# Patient Record
Sex: Female | Born: 1950 | Race: Black or African American | Hispanic: No | Smoking: Light tobacco smoker
Health system: Southern US, Community
[De-identification: ages and names within clinical notes are randomized; demographics above are authoritative.]

## PROBLEM LIST (undated history)

## (undated) DIAGNOSIS — IMO0001 Reserved for inherently not codable concepts without codable children: Secondary | ICD-10-CM

## (undated) HISTORY — DX: Reserved for inherently not codable concepts without codable children: IMO0001

## (undated) HISTORY — PX: OTHER SURGICAL HISTORY: SHX169

## (undated) HISTORY — PX: GASTRIC BYPASS: SHX52

## (undated) HISTORY — PX: TUBAL LIGATION: SHX77

---

## 2004-02-14 LAB — HM COLONOSCOPY: HM Colonoscopy: NORMAL

## 2006-05-23 ENCOUNTER — Encounter: Payer: Self-pay | Admitting: Family Medicine

## 2007-03-20 ENCOUNTER — Encounter: Payer: Self-pay | Admitting: Family Medicine

## 2007-04-01 ENCOUNTER — Encounter: Payer: Self-pay | Admitting: Family Medicine

## 2007-08-13 ENCOUNTER — Encounter: Payer: Self-pay | Admitting: Family Medicine

## 2008-06-04 ENCOUNTER — Encounter: Payer: Self-pay | Admitting: Family Medicine

## 2008-09-10 ENCOUNTER — Encounter: Payer: Self-pay | Admitting: Family Medicine

## 2009-04-29 ENCOUNTER — Ambulatory Visit: Payer: Self-pay | Admitting: Family Medicine

## 2009-04-29 DIAGNOSIS — Z9884 Bariatric surgery status: Secondary | ICD-10-CM | POA: Insufficient documentation

## 2009-04-29 DIAGNOSIS — M5136 Other intervertebral disc degeneration, lumbar region: Secondary | ICD-10-CM

## 2009-04-29 DIAGNOSIS — F3289 Other specified depressive episodes: Secondary | ICD-10-CM | POA: Insufficient documentation

## 2009-04-29 DIAGNOSIS — F329 Major depressive disorder, single episode, unspecified: Secondary | ICD-10-CM

## 2009-04-29 DIAGNOSIS — I2699 Other pulmonary embolism without acute cor pulmonale: Secondary | ICD-10-CM

## 2009-04-29 DIAGNOSIS — M1710 Unilateral primary osteoarthritis, unspecified knee: Secondary | ICD-10-CM

## 2009-05-05 ENCOUNTER — Ambulatory Visit: Payer: Self-pay | Admitting: Family Medicine

## 2009-05-05 LAB — CONVERTED CEMR LAB: Prothrombin Time: 17.1 s

## 2009-05-11 ENCOUNTER — Ambulatory Visit: Payer: Self-pay | Admitting: Family Medicine

## 2009-05-11 DIAGNOSIS — G609 Hereditary and idiopathic neuropathy, unspecified: Secondary | ICD-10-CM | POA: Insufficient documentation

## 2009-05-11 DIAGNOSIS — D510 Vitamin B12 deficiency anemia due to intrinsic factor deficiency: Secondary | ICD-10-CM

## 2009-05-11 DIAGNOSIS — M069 Rheumatoid arthritis, unspecified: Secondary | ICD-10-CM | POA: Insufficient documentation

## 2009-05-11 DIAGNOSIS — L659 Nonscarring hair loss, unspecified: Secondary | ICD-10-CM | POA: Insufficient documentation

## 2009-05-12 LAB — CONVERTED CEMR LAB: Vitamin B-12: 386 pg/mL (ref 211–911)

## 2009-06-22 ENCOUNTER — Ambulatory Visit: Payer: Self-pay | Admitting: Family Medicine

## 2009-07-19 ENCOUNTER — Encounter (INDEPENDENT_AMBULATORY_CARE_PROVIDER_SITE_OTHER): Payer: Self-pay | Admitting: *Deleted

## 2009-07-19 ENCOUNTER — Telehealth: Payer: Self-pay | Admitting: Family Medicine

## 2009-08-05 ENCOUNTER — Ambulatory Visit: Payer: Self-pay | Admitting: Family Medicine

## 2009-08-12 ENCOUNTER — Ambulatory Visit: Payer: Self-pay | Admitting: Family

## 2009-08-12 ENCOUNTER — Other Ambulatory Visit: Admission: RE | Admit: 2009-08-12 | Discharge: 2009-08-12 | Payer: Self-pay | Admitting: Family

## 2009-08-12 LAB — CONVERTED CEMR LAB
Basophils Absolute: 0 10*3/uL (ref 0.0–0.1)
Basophils Relative: 0 % (ref 0–1)
Eosinophils Relative: 1 % (ref 0–5)
HCT: 42.5 % (ref 36.0–46.0)
Lymphs Abs: 1.7 10*3/uL (ref 0.7–4.0)
Monocytes Absolute: 0.4 10*3/uL (ref 0.1–1.0)
Monocytes Relative: 13 % — ABNORMAL HIGH (ref 3–12)
Neutro Abs: 0.8 10*3/uL — ABNORMAL LOW (ref 1.7–7.7)
Neutrophils Relative %: 27 % — ABNORMAL LOW (ref 43–77)
RBC: 4.51 M/uL (ref 3.87–5.11)
WBC: 2.9 10*3/uL — ABNORMAL LOW (ref 4.0–10.5)

## 2009-08-17 ENCOUNTER — Telehealth: Payer: Self-pay | Admitting: Family

## 2009-08-20 ENCOUNTER — Telehealth: Payer: Self-pay | Admitting: Family

## 2009-08-20 DIAGNOSIS — R87610 Atypical squamous cells of undetermined significance on cytologic smear of cervix (ASC-US): Secondary | ICD-10-CM

## 2009-08-20 LAB — CONVERTED CEMR LAB: Pap Smear: UNDETERMINED

## 2009-08-23 ENCOUNTER — Telehealth: Payer: Self-pay | Admitting: Family

## 2009-08-25 ENCOUNTER — Telehealth: Payer: Self-pay | Admitting: Family

## 2009-08-31 ENCOUNTER — Ambulatory Visit: Payer: Self-pay | Admitting: Family Medicine

## 2009-09-02 ENCOUNTER — Encounter: Admission: RE | Admit: 2009-09-02 | Discharge: 2009-09-02 | Payer: Self-pay | Admitting: Family Medicine

## 2009-09-02 ENCOUNTER — Ambulatory Visit: Payer: Self-pay | Admitting: Family

## 2009-09-02 ENCOUNTER — Encounter: Payer: Self-pay | Admitting: Family Medicine

## 2009-09-02 ENCOUNTER — Encounter: Payer: Self-pay | Admitting: Family

## 2009-09-02 ENCOUNTER — Telehealth: Payer: Self-pay | Admitting: Family

## 2009-09-02 LAB — CONVERTED CEMR LAB
AST: 25 units/L (ref 0–37)
Albumin: 4.5 g/dL (ref 3.5–5.2)
Basophils Relative: 0 % (ref 0–1)
Eosinophils Relative: 1 % (ref 0–5)
Hemoglobin: 14.7 g/dL (ref 12.0–15.0)
INR: 1.5
Indirect Bilirubin: 0.6 mg/dL (ref 0.0–0.9)
Lymphocytes Relative: 53 % — ABNORMAL HIGH (ref 12–46)
Lymphs Abs: 1.7 10*3/uL (ref 0.7–4.0)
MCHC: 32.7 g/dL (ref 30.0–36.0)
MCV: 94.5 fL (ref 78.0–100.0)
Platelets: 183 10*3/uL (ref 150–400)
RBC: 4.75 M/uL (ref 3.87–5.11)
RDW: 14.8 % (ref 11.5–15.5)
Total Protein: 7.9 g/dL (ref 6.0–8.3)
WBC: 3.2 10*3/uL — ABNORMAL LOW (ref 4.0–10.5)

## 2009-09-03 ENCOUNTER — Telehealth: Payer: Self-pay | Admitting: Family

## 2009-09-10 ENCOUNTER — Telehealth: Payer: Self-pay | Admitting: Family Medicine

## 2009-09-14 ENCOUNTER — Ambulatory Visit: Payer: Self-pay | Admitting: Obstetrics & Gynecology

## 2009-09-17 ENCOUNTER — Encounter: Admission: RE | Admit: 2009-09-17 | Discharge: 2009-09-17 | Payer: Self-pay | Admitting: Family Medicine

## 2009-10-08 ENCOUNTER — Ambulatory Visit: Payer: Self-pay | Admitting: Family Medicine

## 2009-10-15 ENCOUNTER — Ambulatory Visit: Payer: Self-pay | Admitting: Family Medicine

## 2009-12-07 ENCOUNTER — Ambulatory Visit: Payer: Self-pay | Admitting: Family Medicine

## 2009-12-07 ENCOUNTER — Encounter: Payer: Self-pay | Admitting: *Deleted

## 2009-12-07 DIAGNOSIS — H9319 Tinnitus, unspecified ear: Secondary | ICD-10-CM | POA: Insufficient documentation

## 2009-12-07 DIAGNOSIS — L259 Unspecified contact dermatitis, unspecified cause: Secondary | ICD-10-CM

## 2009-12-07 LAB — CONVERTED CEMR LAB: INR: 1.43 (ref <1.50)

## 2009-12-08 ENCOUNTER — Telehealth: Payer: Self-pay | Admitting: Family Medicine

## 2010-01-27 ENCOUNTER — Encounter: Payer: Self-pay | Admitting: Family Medicine

## 2010-01-28 ENCOUNTER — Ambulatory Visit: Payer: Self-pay | Admitting: Family Medicine

## 2010-01-31 ENCOUNTER — Telehealth: Payer: Self-pay | Admitting: Family Medicine

## 2010-02-21 ENCOUNTER — Ambulatory Visit: Admit: 2010-02-21 | Payer: Self-pay | Admitting: Family Medicine

## 2010-02-28 ENCOUNTER — Ambulatory Visit
Admission: RE | Admit: 2010-02-28 | Discharge: 2010-02-28 | Payer: Self-pay | Source: Home / Self Care | Attending: Family Medicine | Admitting: Family Medicine

## 2010-03-06 ENCOUNTER — Encounter: Payer: Self-pay | Admitting: Family Medicine

## 2010-03-15 NOTE — Letter (Signed)
Summary: Med List/[facility]  Med List/[facility]   Imported By: Lanelle Bal 05/04/2009 09:23:40  _____________________________________________________________________  External Attachment:    Type:   Image     Comment:   External Document

## 2010-03-15 NOTE — Assessment & Plan Note (Signed)
Summary: NOV: Depression, coumadin, etc    Vital Signs:  Patient profile:   60 year old female Height:      67 inches Weight:      250 pounds BMI:     39.30 O2 Sat:      98 % on Room air Pulse rate:   73 / minute BP sitting:   135 / 83  (left arm) Cuff size:   large  Vitals Entered By: Kathlene November (April 29, 2009 10:56 AM)  O2 Flow:  Room air CC: Np- weight gain- severe depression- crying anxiety Is Patient Diabetic? No   CC:  Np- weight gain- severe depression- crying anxiety.  History of Present Illness: Coumadin 4mg  daily since October and hasn't been checked.  Was previously seeing Dr. Ardine Bjork.   was on paxil for qhile for her mood but felt it work well so stopped it. Gets depressed sometimes.  Says now lives along which she loves but she says sometimes gets down and lonely.  Was peviously living wiht her son but evidently this was nto a good arrangment.  Not sleeping well.   Needs a new referral for an orthopedist for her knees and her back. Hard to stand for more than 2-3 minutes.  Has gained about 50 lbs in the less than year. Last set up of blood work was in October. Was on steroid for her PE but she off teh steroids now.  Wants a panniculectomy.  Needs a referral for surgery. Can't remember her name.    Uses betamethasone that she uses for her hair and her skin rashes.   Anticoagulation Management History:      The patient is on coumadin and comes in today for a routine follow up visit.  Today's INR is 1.4.    Habits & Providers  Alcohol-Tobacco-Diet     Alcohol drinks/day: <1     Tobacco Status: current     Cigarette Packs/Day: <0.25     Year Started: 25  Exercise-Depression-Behavior     Does Patient Exercise: no     Have you felt down or hopeless? no     STD Risk: never     Drug Use: no     Seat Belt Use: always  Current Medications (verified): 1)  Betamethasone Dipropionate 0.05 % Oint (Betamethasone Dipropionate) .... Use As Directed As Needed 2)   Verapamil Hcl Cr 360 Mg Xr24h-Cap (Verapamil Hcl) .... Take 1 Cap By Mouth Once Daily 3)  Lyrica 100 Mg Caps (Pregabalin) .... Take 1 Cap By Mouth Three Times A Day 4)  Atenolol 50 Mg Tabs (Atenolol) .... Take 1 Tab By Mouth Once Daily 5)  Warfarin Sodium 4 Mg Tabs (Warfarin Sodium) .... Take 1 Tab By Mouth Once Daily As Directed 6)  Prednisone .... Take As Directed As Needed 7)  Polyethylene Glycol 3350 .... Take 2 Times Daily As Directed As Needed 8)  Calcium 500/d 500-200 Mg-Unit Tabs (Calcium Carbonate-Vitamin D) .... Take One Tablet By Mouth Once A Day 9)  Lorazepam 0.5 Mg Tabs (Lorazepam) .... Take One Tablet By Mouth Once A Day 10)  Xyzal 5 Mg Tabs (Levocetirizine Dihydrochloride) .... Take One Tablet By Mouth Oncea  Day 11)  Vitamin B Complex  Caps (B Complex Vitamins) .... Take One Tablet By Mouth Once A Day 12)  Proventil Hfa 108 (90 Base) Mcg/act Aers (Albuterol Sulfate) 13)  Lyrica 100 Mg Caps (Pregabalin) .... Take One Tablet By Mouth Oncea D Ay 14)  Norflex 30 Mg/ml Soln (  Orphenadrine Citrate)  Allergies (verified): 1)  ! Lisinopril (Lisinopril)  Comments:  Nurse/Medical Assistant: The patient's medications and allergies were reviewed with the patient and were updated in the Medication and Allergy Lists. 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   Past Surgical History: Partial colectomy for diverticulitis.  Tubal ligation Right arm  Gastric bypass > 20 years   Family History: Brothers wiht DM.  Mother with MI, HTN renal failure.    Social History: Disabled. BA degree. Has 2 sons. Single.  Current Smoker Alcohol use-yes Drug use-no Regular exercise-no 2 caffeinated drinks per day. Smoking Status:  current Packs/Day:  <0.25 Does Patient Exercise:  no STD Risk:  never Drug Use:  no Seat Belt Use:  always  Physical Exam  General:   Well-developed,well-nourished,in no acute distress; alert,appropriate and cooperative throughout examination Skin:  no rashes.   Psych:  Oriented X3, labile affect, and tearful.     Impression & Recommendations:  Problem # 1:  PULMONARY EMBOLISM (ICD-415.19)  INR not therapuetic today. H.O given with new doses. Need to get records to see how long needs to be on coumadin. She says she has been on it for 3 years already. Mood questionnair neg for Bipolar.  Her updated medication list for this problem includes:    Warfarin Sodium 4 Mg Tabs (Warfarin sodium) .Marland Kitchen... Take 1 tab by mouth once daily as directed    Coumadin 4 Mg Tabs (Warfarin sodium) ..... Sunday - 4 mg, monday - 6 mg, tuesday - 4 mg, wednesday - 4 mg, thursday - 6 mg, friday - 4 mg, saturday - 4 mg  Orders: Fingerstick (42595) Protime INR (63875)  Problem # 2:  SKIN RASH (ICD-782.1) Did refill today.  Her updated medication list for this problem includes:    Betamethasone Dipropionate 0.05 % Oint (Betamethasone dipropionate) ..... Use as directed as needed    Triamcinolone Acetonide 0.5 % Crea (Triamcinolone acetonide) .Marland Kitchen... Appply two times a day to affected area.  Problem # 3:  DEPRESSION (ICD-311) PHQ-9 score was 17 (moderately severe). Dsicussed starting an SSRI. Warned about potentail SE. F/U in 3 weeks.  Her updated medication list for this problem includes:    Lorazepam 0.5 Mg Tabs (Lorazepam) .Marland Kitchen... Take one tablet by mouth once a day    Citalopram Hydrobromide 20 Mg Tabs (Citalopram hydrobromide) .Marland Kitchen... 1/2 by mouth once a day for one week, then 1 tab daily  Problem # 4:  KNEE PAIN, BILATERAL (ICD-719.46) Wil refer her to ortho. Soujnds like she has been seen by ortho for this in the past, but not recently.   Her updated medication list for this problem includes:    Norflex 30 Mg/ml Soln (Orphenadrine citrate)  Problem # 5:  BACK PAIN, CHRONIC (ICD-724.5) She feels it is from her pannus.   Her updated medication  list for this problem includes:    Norflex 30 Mg/ml Soln (Orphenadrine citrate)  Problem # 6:  BARIATRIC SURGERY STATUS (ICD-V45.86) Will reveiew old records. She may need to additional labs checked to make sure not low nutritent.s   Complete Medication List: 1)  Betamethasone Dipropionate 0.05 % Oint (Betamethasone dipropionate) .... Use as directed as needed 2)  Verapamil Hcl Cr 360 Mg Xr24h-cap (Verapamil hcl) .... Take 1 cap by mouth once daily 3)  Lyrica 100 Mg Caps (Pregabalin) .... Take 1 cap by mouth three times a day  4)  Atenolol 50 Mg Tabs (Atenolol) .... Take 1 tab by mouth once daily 5)  Warfarin Sodium 4 Mg Tabs (Warfarin sodium) .... Take 1 tab by mouth once daily as directed 6)  Prednisone  .... Take as directed as needed 7)  Polyethylene Glycol 3350  .... Take 2 times daily as directed as needed 8)  Calcium 500/d 500-200 Mg-unit Tabs (Calcium carbonate-vitamin d) .... Take one tablet by mouth once a day 9)  Lorazepam 0.5 Mg Tabs (Lorazepam) .... Take one tablet by mouth once a day 10)  Xyzal 5 Mg Tabs (Levocetirizine dihydrochloride) .... Take one tablet by mouth oncea  day 11)  Vitamin B Complex Caps (B complex vitamins) .... Take one tablet by mouth once a day 12)  Proventil Hfa 108 (90 Base) Mcg/act Aers (Albuterol sulfate) 13)  Lyrica 100 Mg Caps (Pregabalin) .... Take one tablet by mouth oncea d ay 14)  Norflex 30 Mg/ml Soln (Orphenadrine citrate) 15)  Triamcinolone Acetonide 0.5 % Crea (Triamcinolone acetonide) .... Appply two times a day to affected area. 16)  Coumadin 4 Mg Tabs (Warfarin sodium) .... "Sunday - 4 mg, monday - 6 mg, tuesday - 4 mg, wednesday - 4 mg, thursday - 6 mg, friday - 4 mg, saturday - 4 mg 17)  Citalopram Hydrobromide 20 Mg Tabs (Citalopram hydrobromide) .... 1/2 by mouth once a day for one week, then 1 tab daily  Anticoagulation Management Assessment/Plan:      She is to have a PT/INR in 1 week.         Current Anticoagulation  Instructions: Warfarin sodium 4 mg tabs: Take 1 tab by mouth once daily as directed Coumadin 4 mg tabs:  Sunday - 4 mg, Monday - 6 mg, Tuesday - 4 mg, Wednesday - 4 mg, Thursday - 6 mg, Friday - 4 mg, Saturday - 4 mg.  The patient's dosage of coumadin will be increased.  The new dosage includes: Repeat PT/INR in 1 week.    Patient Instructions: 1)  Follow up to recheck your coumadin in one week.  Prescriptions: CITALOPRAM HYDROBROMIDE 20 MG TABS (CITALOPRAM HYDROBROMIDE) 1/2 by mouth once a day for one week, then 1 tab daily  #30 x 0   Entered and Authorized by:   Bridney Guadarrama MD   Signed by:   Atha Mcbain MD on 04/29/2009   Method used:   Electronically to        [facility] [address] [id number]* (retail)       [address]       [place], [place]  [address]       Ph: [phone]       Fax: [phone]   RxID:   [id number] TRIAMCINOLONE ACETONIDE 0.5 % CREA (TRIAMCINOLONE ACETONIDE) appply two times a day to affected area.  #40 grams x 0   Entered and Authorized by:   Lillyan Hitson MD   Signed by:   Aubrea Meixner MD on 04/29/2009   Method used:   Electronically to        [facility] [address] [id number]* (retail)       [address], [place]  [address]       Ph: [phone]       Fax: [phone]   RxID:   [id number] ATENOLOL 50 MG TABS (ATENOLOL) Take 1 tab by mouth once daily  #30 x 1   Entered and Authorized by:   Nani Gasser MD   Signed  by:   Nani Gasser MD on 04/29/2009   Method used:   Electronically to        [facility] [phone]* (retail)       [address] [place], [place]  [address]       Ph: [phone]       Fax: [phone]   RxID:   [id number] BETAMETHASONE DIPROPIONATE 0.05 % OINT (BETAMETHASONE DIPROPIONATE) Use as directed as needed  #40 grams x 1   Entered and Authorized by:   Nani Gasser MD   Signed by:   Nani Gasser MD on 04/29/2009   Method used:   Electronically to        [facility]  [phone]* (retail)       [address] [place], [place]  [address]       Ph: [phone]       Fax: [phone]   RxID:   [id number]   Laboratory Results   Blood Tests   Date/Time Received: 04/29/2009 Date/Time Reported: 04/29/2009  PT: 14.4 s   (Normal Range: 10.6-13.4)  INR: 1.4   (Normal Range: 0.88-1.12   Therap INR: 2.0-3.5)     Appended Document: NOV: Depression, coumadin, etc   TD Result Date:  02/13/2002 TD Result:  given TD Next Due:  10 yr HDL Result Date:  03/17/2007 HDL Result:  80 HDL Next Due:  1 yr LDL Result Date:  03/17/2007 LDL Result:  104 LDL Next Due:  1 yr Flex Sig Next Due:  Not Indicated Colonoscopy Result Date:  02/14/2004 Colonoscopy Result:  normal Hemoccult Next Due:  Not Indicated PAP Result Date:  05/23/2006 PAP Result:  normal PAP Next Due:  2 yr Mammogram Result Date:  08/13/2007 Mammogram Result:  normal Mammogram Next Due:  1 yr     Past History:  Past Medical History: 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 Estella Husk

## 2010-03-15 NOTE — Progress Notes (Signed)
Summary: Pt no show for [facility] appt  Phone Note From Other Clinic   Caller: Provider Summary of Call: SW Aram Beecham at [facility], pt was a "no show" for her appt today Initial call taken by: Lannette Donath,  August 25, 2009 9:45 AM  Follow-up for Phone Call        Pt told me that she had rescheduled with Dr. Norberto Sorenson to August 11th.  Could you please call office and verify this.  Follow-up by: Lemont Fillers FNP,  August 25, 2009 10:13 AM  Additional Follow-up for Phone Call Additional follow up Details #1::        I called Aram Beecham again at [facility] [phone] & she said the patient does not have an appt 8/11 and that pt scheduled this morning's 8:45 appt yesterday afternoon since she needed to be seen "right away" Diane Tomerlin  August 25, 2009 11:00 AM    Additional Follow-up for Phone Call Additional follow up Details #2::    Please call patient and let her know that it is important that she reschedule her GYN visit.  Follow-up by: Lemont Fillers FNP,  August 25, 2009 11:14 AM  Additional Follow-up for Phone Call Additional follow up Details #3:: Details for Additional Follow-up Action Taken: LM on pt voicemail. Additional Follow-up by: Kathlene November,  August 25, 2009 11:21 AM

## 2010-03-15 NOTE — Progress Notes (Signed)
Summary: rx feill  Phone Note Call from Patient   Caller: Patient Summary of Call: Patient said she called her pharmacy yesterday and they told her she needed to call here to get a refill on her meds.  Polyethylene and Triamcinole Initial call taken by: Vanessa Swaz[place],  July 19, 2009 11:02 AM    Prescriptions: TRIAMCINOLONE ACETONIDE 0.5 % CREA (TRIAMCINOLONE ACETONIDE) appply two times a day to affected area.  #40 grams x 1   Entered by:   Kathlene November   Authorized by:   Nani Gasser MD   Signed by:   Kathlene November on 07/19/2009   Method used:   Electronically to        [facility] [phone]* (retail)       [address] [place], [place]  [address]       Ph: [phone]       Fax: [phone]   RxID:   [id number] POLYETHYLENE GLYCOL 3350 Take 2 times daily as directed as needed  #1 x 2   Entered by:   Kathlene November   Authorized by:   Nani Gasser MD   Signed by:   Kathlene November on 07/19/2009   Method used:   Print then Give to Patient   RxID:   [id number]

## 2010-03-15 NOTE — Assessment & Plan Note (Signed)
Summary: cpe with pap- jr   Vital Signs:  Patient profile:   60 year old female Height:      67 inches Weight:      230 pounds Pulse rate:   81 / minute BP sitting:   140 / 93  (left arm) Cuff size:   large  Vitals Entered By: Kathlene November (August 12, 2009 3:20 PM) CC: physical with pap   Primary Care Provider:  Nani Gasser MD  CC:  physical with pap.  History of Present Illness: Andrea Cisneros is a 60 year old female who presents today for a "complete physical and Pap smear".  She has several complaints today:  1) Uterine cramping-  Notes that she has severe cramping- though she does not actually bleed.  Notes occasional pink discharge.  LMP was 3 years ago- notes that she has history of fibroids.   2) Skin rash- reports that she has seen derm in the past and has been prescribed topical steroid ointments.  Reports that Dr. Linford Arnold decreased the dose of her betamethasone from 1% down to 0.5%. She is requesting rx refills.  Current Medications (verified): 1)  Betamethasone Dipropionate 0.05 % Oint (Betamethasone Dipropionate) .... Use As Directed As Needed 2)  Verapamil Hcl Cr 360 Mg Xr24h-Cap (Verapamil Hcl) .... Take 1 Cap By Mouth Once Daily 3)  Lyrica 100 Mg Caps (Pregabalin) .... Take 1 Cap By Mouth Three Times A Day 4)  Atenolol 50 Mg Tabs (Atenolol) .... Take 1 Tab By Mouth Once Daily 5)  Warfarin Sodium 4 Mg Tabs (Warfarin Sodium) .... Take 1 Tab By Mouth Once Daily As Directed 6)  Polyethylene Glycol 3350 .... Take 2 Times Daily As Directed As Needed 7)  Calcium 500/d 500-200 Mg-Unit Tabs (Calcium Carbonate-Vitamin D) .... Take One Tablet By Mouth Once A Day 8)  Vitamin B Complex  Caps (B Complex Vitamins) .... Take One Tablet By Mouth Once A Day 9)  Proventil Hfa 108 (90 Base) Mcg/act Aers (Albuterol Sulfate) 10)  Lyrica 100 Mg Caps (Pregabalin) .... Take One Tablet By Mouth Oncea D Ay 11)  Triamcinolone Acetonide 0.5 % Crea (Triamcinolone Acetonide) .... Appply Two  Times A Day To Affected Area. 12)  Citalopram Hydrobromide 20 Mg Tabs (Citalopram Hydrobromide) .... 1/2 By Mouth Once A Day For One Week, Then 1 Tab Daily 13)  Amoxicillin 500 Mg Caps (Amoxicillin) .... Take 1 Tablet By Mouth Three Times A Day For 10 Days 14)  Clindamycin Hcl 300 Mg Caps (Clindamycin Hcl) .... Take 1 Tablet By Mouth Four Times A Day For 10 Days 15)  Hydrocodone-Acetaminophen 5-500 Mg Tabs (Hydrocodone-Acetaminophen) .Marland Kitchen.. 1-2 Tab By Mouth Every 4-6 Hour As Needed 16)  Coumadin 4 Mg Tabs (Warfarin Sodium) .... Sunday - 4 Mg, Monday - 6 Mg, Tuesday - 4 Mg, Wednesday - 4 Mg, Thursday - 4 Mg, Friday - 4 Mg, Saturday - 6 Mg 17)  Coumadin 1 Mg Tabs (Warfarin Sodium) .... Sunday - 4 Mg, Monday - 6 Mg, Tuesday - 4 Mg, Wednesday - 4 Mg, Thursday - 4 Mg, Friday - 4 Mg, Saturday - 6 Mg  Allergies (verified): 1)  ! Lisinopril (Lisinopril)  Comments:  Nurse/Medical Assistant: The patient's medications and allergies were reviewed with the patient and were updated in the Medication and Allergy Lists. 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 Estella Husk  Past Surgical History: Last updated: 04/29/2009 Partial colectomy for diverticulitis.  Tubal ligation Right arm  Gastric bypass > 20 years   Family History: Last updated: 04/29/2009 Brothers wiht DM.  Mother with MI, HTN renal failure.    Social History: Last updated: 04/29/2009 Disabled. BA degree. Has 2 sons. Single.  Current Smoker Alcohol use-yes Drug use-no Regular exercise-no 2 caffeinated drinks per day.   Risk Factors: Alcohol Use: <1 (04/29/2009) Exercise: no (04/29/2009)  Risk Factors: Smoking Status: current (04/29/2009) Packs/Day: <0.25 (04/29/2009)  Review of Systems       Constitutional: Denies Fever ENT:  notes chronic allergic rhinits or sore throat. Resp: Denies  cough CV:  Denies Chest Pain or SOB GI:  Denies nausea or vomitting GU: Denies dysuria  Lymphatic: Denies lymphadenopathy Musculoskeletal:  notes + fibromyalgia pain, and bilateral right shoulder Skin:  complains of rash on her face since worse since dose of steroid cream was reduced- she is requesting refill on her topical steroid cream Psychiatric: notes + depression- though reports that it is controlled on citalopram. 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 No apparent alopecia or balding. Eyes:  PERRLA Ears:  External ear exam shows no significant lesions or deformities.  Otoscopic examination reveals clear canals, tympanic membranes are intact bilaterally without bulging, retraction, inflammation or discharge. Hearing is grossly normal bilaterally. Mouth:  Oral mucosa and oropharynx without lesions or exudates.  Teeth in good repair. Neck:  No deformities, masses, or tenderness noted. Chest Wall:  No deformities, masses, or tenderness noted. Breasts:  No mass, nodules, thickening, tenderness, bulging, retraction, inflamation, nipple discharge or skin changes noted.   Lungs:  Normal respiratory effort, chest expands symmetrically. Lungs are clear to auscultation, no crackles or wheezes. Heart:  Normal rate and regular rhythm. S1 and S2 normal without gallop, murmur, click, rub or other extra sounds. Abdomen:  +pannus- soft, non-tender, non-distended Rectal:  No external hemorrhoids noted, no anal lesions, no perianal rash.   Genitalia:  Pelvic Exam:        External: normal female genitalia without lesions or masses        Vagina: normal without lesions or masses        Cervix: normal without lesions or masses        Adnexa: tender adnexal bilaterally         Uterus: enlarged        Pap smear: performed Msk:  No deformity or scoliosis noted of thoracic or lumbar spine.   Extremities:  No clubbing, cyanosis, edema, or deformity noted  Neurologic:  No cranial nerve deficits noted. Station and gait are normal. Plantar reflexes are down-going bilaterally. DTRs are symmetrical throughout. Sensory, motor and coordinative functions appear intact. Skin:  + hyperpigmented rash lesions noted on face and chest/arms. + intertrigo noted beneath breasts/pannus Cervical Nodes:  No lymphadenopathy noted Axillary Nodes:  No palpable lymphadenopathy Inguinal Nodes:  No significant adenopathy Psych:  Oriented X3, memory intact for recent and remote, normally interactive, not anxious appearing, and not depressed appearing.     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   Orders: T-CBC w/Diff ([id number])  Problem # 2:  MENORRHAGIA, POSTMENOPAUSAL (ICD-627.1) Assessment: New  Patient with cramping and "pink" vaginal discharge which is intermittent  in nature.  Suspect uterine fibroids based on examination.  Pap performed today, will refer to GYN for further evaluation.  Orders: Gastroenterology Referral (GI)  Problem # 3:  SKIN RASH (ICD-782.1) Assessment: Unchanged Has been followed by Derm- and was rx'd steroid cream.  Will try to obtain records from [facility].  Add nystatin for intertrigo beneath breasts and abdomen. Her updated medication list for this problem includes:    Betamethasone Valerate 0.1 % Crea (Betamethasone valerate) .Marland Kitchen... Apply twice daily to affected area    Triamcinolone Acetonide 0.5 % Crea (Triamcinolone acetonide) .Marland Kitchen... Appply two times a day to affected area.  Complete Medication List: 1)  Betamethasone Valerate 0.1 % Crea (Betamethasone valerate) .... Apply twice daily to affected area 2)  Verapamil Hcl Cr 360 Mg Xr24h-cap (Verapamil hcl) ....  Take 1 cap by mouth once daily 3)  Lyrica 100 Mg Caps (Pregabalin) .... Take 1 cap by mouth three times a day 4)  Atenolol 50 Mg Tabs (Atenolol) .... Take 1 tab by mouth once daily 5)  Warfarin Sodium 4 Mg Tabs (Warfarin sodium) .... Take 1 tab by mouth once daily as directed 6)  Polyethylene Glycol 3350  .... Take 2 times daily as directed as needed 7)  Calcium 500/d 500-200 Mg-unit Tabs (Calcium carbonate-vitamin d) .... Take one tablet by mouth once a day 8)  Vitamin B Complex Caps (B complex vitamins) .... Take one tablet by mouth once a day 9)  Proventil Hfa 108 (90 Base) Mcg/act Aers (Albuterol sulfate) 10)  Lyrica 100 Mg Caps (Pregabalin) .... Take one tablet by mouth oncea d ay 11)  Triamcinolone Acetonide 0.5 % Crea (Triamcinolone acetonide) .... Appply two times a day to affected area. 12)  Citalopram Hydrobromide 20 Mg Tabs (Citalopram hydrobromide) .... One tablet by mouth daily 13)  Amoxicillin 500 Mg Caps (Amoxicillin) .... Take 1 tablet by mouth three times a day for 10 days 14)  Clindamycin Hcl 300 Mg Caps (Clindamycin hcl) .... Take 1 tablet by mouth four times a day for 10 days 15)  Hydrocodone-acetaminophen 5-500 Mg Tabs (Hydrocodone-acetaminophen) .Marland Kitchen.. 1-2 tab by mouth every 4-6 hour as needed 16)  Coumadin 4 Mg Tabs (Warfarin sodium) .... Sunday - 4 mg, monday - 6 mg, tuesday - 4 mg, wednesday - 4 mg, thursday - 4 mg, friday - 4 mg, saturday - 6 mg 17)  Coumadin 1 Mg Tabs (Warfarin sodium) .... Sunday - 4 mg, monday - 6 mg, tuesday - 4 mg, wednesday - 4 mg, thursday - 4 mg, friday - 4 mg, saturday - 6 mg 18)  Nystatin Powd (Nystatin) .... Apply twice daily beneath breasts and abdomen  Other Orders: Pelvic & Breast Exam ( Medicare)  ([id number])  Patient Instructions: 1)  You will be called about your pap smear results and your referral to GYN. 2)  Please complete your blood work downstairs. 3)  Follow up in 3 months with Dr. Linford Arnold. Prescriptions: TRIAMCINOLONE ACETONIDE  0.5 % CREA (TRIAMCINOLONE ACETONIDE) appply two times a day to affected area.  #40 grams x 0   Entered and Authorized by:   Lemont Fillers FNP   Signed by:   Lemont Fillers FNP on 08/17/2009   Method used:   Printed then faxed to ...       [facility] (mail-order)             , [place]         Ph: [phone]       Fax:  [phone]   RxID:   [id number] NYSTATIN  POWD (NYSTATIN) apply twice daily beneath breasts and abdomen  #1 x 0   Entered and Authorized by:   Lemont Fillers FNP   Signed by:   Lemont Fillers FNP on 08/17/2009   Method used:   Printed then faxed to ...       [facility] (mail-order)             , [place]         Ph: [phone]       Fax: [phone]   RxID:   [id number] BETAMETHASONE VALERATE 0.1 % CREA (BETAMETHASONE VALERATE) apply twice daily to affected area  #1 x 0   Entered and Authorized by:   Lemont Fillers FNP   Signed by:   Lemont Fillers FNP on 08/17/2009   Method used:   Printed then faxed to ...       [facility] (mail-order)             , [place]         Ph: [phone]       Fax: [phone]   RxID:   [id number]

## 2010-03-15 NOTE — Assessment & Plan Note (Signed)
Summary: Sinusitis, coumadin   Vital Signs:  Patient profile:   60 year old female Height:      67 inches Weight:      250 pounds Temp:     98.6 degrees F oral Pulse rate:   73 / minute BP sitting:   132 / 84  (left arm) Cuff size:   large  Vitals Entered By: Kathlene November (May 11, 2009 1:56 PM) CC: cough with green phlegm, H/A, sinus congestion for 3 days   CC:  cough with green phlegm, H/A, and sinus congestion for 3 days.  History of Present Illness: cough with green phlegm,frontal H/A, sinus congestion for 3 days.  Cough is wet and productive. Rattling when she breaths.  Gets this about 3 x a year. No fever.  Hx of allergies. Not using any allergy medicines.   Anticoagulation Management History:      The patient is on coumadin and comes in today for a routine follow up visit.  Her last INR was 1.9 and today's INR is 2.8.    Current Medications (verified): 1)  Betamethasone Dipropionate 0.05 % Oint (Betamethasone Dipropionate) .... Use As Directed As Needed 2)  Verapamil Hcl Cr 360 Mg Xr24h-Cap (Verapamil Hcl) .... Take 1 Cap By Mouth Once Daily 3)  Lyrica 100 Mg Caps (Pregabalin) .... Take 1 Cap By Mouth Three Times A Day 4)  Atenolol 50 Mg Tabs (Atenolol) .... Take 1 Tab By Mouth Once Daily 5)  Warfarin Sodium 4 Mg Tabs (Warfarin Sodium) .... Take 1 Tab By Mouth Once Daily As Directed 6)  Polyethylene Glycol 3350 .... Take 2 Times Daily As Directed As Needed 7)  Calcium 500/d 500-200 Mg-Unit Tabs (Calcium Carbonate-Vitamin D) .... Take One Tablet By Mouth Once A Day 8)  Vitamin B Complex  Caps (B Complex Vitamins) .... Take One Tablet By Mouth Once A Day 9)  Proventil Hfa 108 (90 Base) Mcg/act Aers (Albuterol Sulfate) 10)  Lyrica 100 Mg Caps (Pregabalin) .... Take One Tablet By Mouth Oncea D Ay 11)  Triamcinolone Acetonide 0.5 % Crea (Triamcinolone Acetonide) .... Appply Two Times A Day To Affected Area. 12)  Citalopram Hydrobromide 20 Mg Tabs (Citalopram Hydrobromide) ....  1/2 By Mouth Once A Day For One Week, Then 1 Tab Daily  Allergies (verified): 1)  ! Lisinopril (Lisinopril)  Comments:  Nurse/Medical Assistant: The patient's medications and allergies were reviewed with the patient and were updated in the Medication and Allergy Lists. 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 Estella Husk  Physical Exam  General:  Well-developed,well-nourished,in no acute distress; alert,appropriate and cooperative throughout examination Head:  Normocephalic and atraumatic without obvious abnormalities. No apparent alopecia or balding. Eyes:  No corneal or conjunctival inflammation noted. EOMI. Perrla.  Ears:  External ear exam shows no significant lesions or deformities.  Otoscopic examination reveals clear canals, tympanic membranes are intact bilaterally without bulging, retraction, inflammation or discharge. Hearing is grossly normal bilaterally. Nose:  External nasal examination shows no deformity or inflammation. Nasal mucosa are pink and moist without lesions or exudates. Mouth:  Oral mucosa and oropharynx without lesions or exudates.  Teeth in good repair. Neck:  No deformities, masses, or tenderness noted. Lungs:  Normal respiratory effort, chest expands symmetrically. Lungs are clear to auscultation, no crackles or wheezes. Heart:  Normal rate and regular rhythm. S1 and S2 normal  without gallop, murmur, click, rub or other extra sounds. Skin:  no rashes.   Cervical Nodes:  No lymphadenopathy noted Psych:  Cognition and judgment appear intact. Alert and cooperative with normal attention span and concentration. No apparent delusions, illusions, hallucinations   Impression & Recommendations:  Problem # 1:  SINUSITIS - ACUTE-NOS (ICD-461.9) Assessment New  Her updated medication list for this problem includes:    Amoxicillin 500  Mg Caps (Amoxicillin) .Marland Kitchen... Take 1 tablet by mouth three times a day for 10 days  Instructed on treatment. Call if symptoms persist or worsen.   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 Her last injection was in October per her old records.   Complete Medication List: 1)  Betamethasone Dipropionate 0.05 % Oint (Betamethasone dipropionate) .... Use as directed as needed 2)  Verapamil Hcl Cr 360 Mg Xr24h-cap (Verapamil hcl) .... Take 1 cap by mouth once daily 3)  Lyrica 100 Mg Caps (Pregabalin) .... Take 1 cap by mouth three times a day 4)  Atenolol 50 Mg Tabs (Atenolol) .... Take 1 tab by mouth once daily 5)  Warfarin Sodium 4 Mg Tabs (Warfarin sodium) .... Take 1 tab by mouth once daily as directed 6)  Polyethylene Glycol 3350  .... Take 2 times daily as directed as needed 7)  Calcium 500/d 500-200 Mg-unit Tabs (Calcium carbonate-vitamin d) .... Take one tablet by mouth once a day 8)  Vitamin B Complex Caps (B complex vitamins) .... Take one tablet by mouth once a day 9)  Proventil Hfa 108 (90 Base) Mcg/act Aers (Albuterol sulfate) 10)  Lyrica 100 Mg Caps (Pregabalin) .... Take one tablet by mouth oncea d ay 11)  Triamcinolone Acetonide 0.5 % Crea (Triamcinolone acetonide) .... Appply two times a day to affected area. 12)  Citalopram Hydrobromide 20 Mg Tabs (Citalopram hydrobromide) .... 1/2 by mouth once a day for one week, then 1 tab daily 13)  Amoxicillin 500 Mg Caps (Amoxicillin) .... Take 1 tablet by mouth three times a day for 10 days 14)  Warfarin Sodium 6 Mg Tabs (Warfarin sodium) .... Take as directed.  Other Orders: Fingerstick ([id number]) Protime INR (09811)  Anticoagulation Management Assessment/Plan:      The target INR is 2.0-3.0.  She is to have a PT/INR in 2 weeks.  Anticoagulation instructions were given to patient.         Current Anticoagulation Instructions: The patient is to continue  with the same dose of coumadin.  This dosage includes: Warfarin sodium 4 mg tabs: Take 1 tab by mouth once daily as directed.  Repeat PT/INR in 2 weeks.   Prescriptions: TRIAMCINOLONE ACETONIDE 0.5 % CREA (TRIAMCINOLONE ACETONIDE) appply two times a day to affected area.  #40 grams x 1   Entered and Authorized by:   Nani Gasser MD   Signed by:   Nani Gasser MD on 05/11/2009   Method used:   Printed then faxed to ...       [facility] (mail-order)             , [place]         Ph: [phone]       Fax: [phone]   RxID:   [id number] CITALOPRAM HYDROBROMIDE 20 MG TABS (CITALOPRAM HYDROBROMIDE) 1/2 by mouth once a day for one week, then 1 tab daily  #90 x 1   Entered and Authorized by:   Nani Gasser MD   Signed by:  Nani Gasser MD on 05/11/2009   Method used:   Printed then faxed to ...       [facility] (mail-order)             , [place]         Ph: [phone]       Fax: [phone]   RxID:   [id number] WARFARIN SODIUM 6 MG TABS (WARFARIN SODIUM) Take as directed.  #90 x 1   Entered and Authorized by:   Nani Gasser MD   Signed by:   Nani Gasser MD on 05/11/2009   Method used:   Printed then faxed to ...       [facility] (mail-order)             , [place]         Ph: [phone]       Fax: [phone]   RxID:   [id number] WARFARIN SODIUM 4 MG TABS (WARFARIN SODIUM) Take 1 tab by mouth once daily as directed  #90 x 1   Entered and Authorized by:   Nani Gasser MD   Signed by:   Nani Gasser MD on 05/11/2009   Method used:   Printed then faxed to ...       [facility] (mail-order)             , [place]         Ph: [phone]       Fax: [phone]   RxID:   [id number] ATENOLOL 50 MG TABS (ATENOLOL) Take 1 tab by mouth once daily  #90 x 1   Entered and Authorized by:   Nani Gasser MD   Signed by:   Nani Gasser MD on 05/11/2009   Method used:   Printed then faxed to  ...       [facility] (mail-order)             , [place]         Ph: [phone]       Fax: [phone]   RxID:   [id number] VERAPAMIL HCL CR 360 MG XR24H-CAP (VERAPAMIL HCL) Take 1 cap by mouth once daily  #90 x 3   Entered and Authorized by:   Nani Gasser MD   Signed by:   Nani Gasser MD on 05/11/2009   Method used:   Printed then faxed to ...       [facility] (mail-order)             , [place]         Ph: [phone]       Fax: [phone]   RxID:   [id number] AMOXICILLIN 500 MG CAPS (AMOXICILLIN) Take 1 tablet by mouth three times a day for 10 days  #30 x 0   Entered and Authorized by:   Nani Gasser MD   Signed by:   Nani Gasser MD on 05/11/2009   Method used:   Electronically to        [facility] [phone]* (retail)       [address] [place], [place]  [address]       Ph: [phone]       Fax: [phone]   RxID:   [id number]   Laboratory Results   Blood Tests   Date/Time Received: 05/10/2009 Date/Time Reported: 05/10/2009  PT: 20.1 s   (Normal Range: 10.6-13.4)  INR: 2.8   (Normal Range: 0.88-1.12   Therap  INR: 2.0-3.5)

## 2010-03-15 NOTE — Letter (Signed)
Summary: Records Dated 05-09-06 thru 12-07-08/[facility]  Records Dated 05-09-06 thru 12-07-08/[facility]   Imported By: Lanelle Bal 05/20/2009 08:39:04  _____________________________________________________________________  External Attachment:    Type:   Image     Comment:   External Document

## 2010-03-15 NOTE — Assessment & Plan Note (Signed)
Summary: Ringing in her ears   Vital Signs:  Patient profile:   60 year old female Height:      67 inches Weight:      219 pounds Pulse rate:   80 / minute BP sitting:   123 / 92  (right arm) Cuff size:   large  Vitals Entered By: Avon Gully CMA, Duncan Dull) (December 07, 2009 3:37 PM) CC: ringing in the ears x 2 months   Primary Care Provider:  Nani Gasser MD  CC:  ringing in the ears x 2 months.  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 Usually gets from her derm.  Wants to know if I will refill it.    Ringing in ears for 2 months for intermittant. Can last 3-4 days at a time. Gets pain posterior to the right ear that radiates upward and this scares her.  Ring is worse on the left. no hearing loss.  Was tested in Aug. No ear drainage. Using hypoflavanoids and that is not helping. Her mother ahd surgery on her ears for ringing. Has been using benadrul for sinus pressure  Felt the betamethasone cream is not as strong as the oint and would like it to be changed back to the ointment.    Says her ortho wants me to rx pain meds for her knees. I have not evaluated this for her thus she really needs to get her pain meds from her ortho.    She also says that she feels her hair is thinning again.   Current Medications (verified): 1)  Betamethasone Valerate 0.1 % Crea (Betamethasone Valerate) .... Apply Twice Daily To Affected Area 2)  Verapamil Hcl Cr 360 Mg Xr24h-Cap (Verapamil Hcl) .... Take 1 Cap By Mouth Once Daily 3)  Lyrica 100 Mg Caps (Pregabalin) .... Take 1 Cap By Mouth Three Times A Day 4)  Atenolol 50 Mg Tabs (Atenolol) .... Take 1 Tab By Mouth Once Daily 5)  Warfarin Sodium 4 Mg Tabs (Warfarin Sodium) .... Take 1 Tab By Mouth Once Daily As Directed 6)  Polyethylene Glycol 3350 .... Take 2 Times Daily As Directed As Needed 7)  Calcium 500/d 500-200 Mg-Unit Tabs (Calcium Carbonate-Vitamin D) .... Take  One Tablet By Mouth Once A Day 8)  Vitamin B Complex  Caps (B Complex Vitamins) .... Take One Tablet By Mouth Once A Day 9)  Proventil Hfa 108 (90 Base) Mcg/act Aers (Albuterol Sulfate) 10)  Triamcinolone Acetonide 0.5 % Crea (Triamcinolone Acetonide) .... Appply Two Times A Day To Affected Area. 11)  Citalopram Hydrobromide 20 Mg Tabs (Citalopram Hydrobromide) .... One Tablet By Mouth Daily 12)  Hydrocodone-Acetaminophen 5-500 Mg Tabs (Hydrocodone-Acetaminophen) .Marland Kitchen.. 1-2 Tab By Mouth Every 4-6 Hour As Needed 13)  Nystatin  Powd (Nystatin) .... Apply Twice Daily Beneath Breasts and Abdomen 14)  Coumadin 4 Mg Tabs (Warfarin Sodium) .... Sunday - 4 Mg, Monday - 6 Mg, Tuesday - 4 Mg, Wednesday - 4 Mg, Thursday - 4 Mg, Friday - 4 Mg, Saturday - 4 Mg 15)  Coumadin 1 Mg Tabs (Warfarin Sodium) .... Sunday - 4 Mg, Monday - 6 Mg, Tuesday - 4 Mg, Wednesday - 4 Mg, Thursday - 4 Mg, Friday - 4 Mg, Saturday - 4 Mg  Allergies (verified): 1)  ! Lisinopril (Lisinopril)  Comments:  Nurse/Medical Assistant: The patient's medications and allergies were reviewed with the patient and were updated in the Medication and Allergy Lists. Avon Gully CMA, (AAMA) (  December 07, 2009 3:44 PM)  Family History: Reviewed history from 04/29/2009 and no changes required. Brothers wiht DM.  Mother with MI, HTN renal failure.    Social History: Reviewed history from 04/29/2009 and no changes required. Disabled. BA degree. Has 2 sons. Single.  Current Smoker Alcohol use-yes Drug use-no Regular exercise-no 2 caffeinated drinks per day.   Physical Exam  General:  Well-developed,well-nourished,in no acute distress; alert,appropriate and cooperative throughout examination Head:  Normocephalic and atraumatic without obvious abnormalities. Seh is wearing a wig today.  Eyes:  No corneal or conjunctival inflammation noted. EOMI. Perrla. Ears:  External ear exam shows no significant lesions or deformities.  Otoscopic  examination reveals clear canals, tympanic membranes are intact bilaterally without bulging, retraction, inflammation or discharge. Hearing is grossly normal bilaterally. Nose:  External nasal examination shows no deformity or inflammation. Nasal mucosa are pink and moist without lesions or exudates. Mouth:  Oral mucosa and oropharynx without lesions or exudates.  Teeth in good repair. Neck:  No deformities, masses, or tenderness noted. Lungs:  Normal respiratory effort, chest expands symmetrically. Lungs are clear to auscultation, no crackles or wheezes. Heart:  Normal rate and regular rhythm. S1 and S2 normal without gallop, murmur, click, rub or other extra sounds. Skin:  no rashes.   Cervical Nodes:  No lymphadenopathy noted Psych:  Cognition and judgment appear intact. Alert and cooperative with normal attention span and concentration. No apparent delusions, illusions, hallucinations   Impression & Recommendations:  Problem # 1:  TINNITUS (ICD-388.30)  Dsicussed unclear etiology. ear exam is normal today. No sign of infection or fluid. Tympanometry was normal.  Will refer to ENT for further evaluation.   Orders: ENT Referral (ENT) Tympanometry [phone])  Problem # 2:  PULMONARY EMBOLISM (ICD-415.19) Unable to get good sample wiht the fingerstick. Will send to the lab and adjust her dose.  Her updated medication list for this problem includes:    Warfarin Sodium 4 Mg Tabs (Warfarin sodium) .Marland Kitchen... Take 1 tab by mouth once daily as directed    Coumadin 4 Mg Tabs (Warfarin sodium) ..... Sunday - 4 mg, monday - 6 mg, tuesday - 4 mg, wednesday - 4 mg, thursday - 6 mg, friday - 4 mg, saturday - 4 mg    Coumadin 1 Mg Tabs (Warfarin sodium) ..... Sunday - 4 mg, monday - 6 mg, tuesday - 4 mg, wednesday - 4 mg, thursday - 6 mg, friday - 4 mg, saturday - 4 mg  Orders: T- * Misc. Laboratory test [phone])  Problem # 3:  UNSPECIFIED ALOPECIA (ICD-704.00) Recommmend return to [facility] if noticing some  hair thinning again.   Problem # 4:  ECZEMA (ICD-692.9) I did refill her topical steroids.  The following medications were removed from the medication list:    Promethazine Hcl 25 Mg Tabs (Promethazine hcl) .Marland Kitchen... Take 1 tablet by mouth three times a day as needed nausea Her updated medication list for this problem includes:    Betamethasone Dipropionate 0.05 % Oint (Betamethasone dipropionate) .Marland Kitchen... Apply daily to affected are.    Triamcinolone Acetonide 0.5 % Oint (Triamcinolone acetonide) .Marland Kitchen... Apply two times a day to affected area.  Complete Medication List: 1)  Betamethasone Dipropionate 0.05 % Oint (Betamethasone dipropionate) .... Apply daily to affected are. 2)  Verapamil Hcl Cr 360 Mg Xr24h-cap (Verapamil hcl) .... Take 1 cap by mouth once daily 3)  Lyrica 100 Mg Caps (Pregabalin) .... Take 1 cap by mouth three times a day 4)  Atenolol 50 Mg  Tabs (Atenolol) .... Take 1 tab by mouth once daily 5)  Warfarin Sodium 4 Mg Tabs (Warfarin sodium) .... Take 1 tab by mouth once daily as directed 6)  Polyethylene Glycol 3350  .... Take 2 times daily as directed as needed 7)  Calcium 500/d 500-200 Mg-unit Tabs (Calcium carbonate-vitamin d) .... Take one tablet by mouth once a day 8)  Vitamin B Complex Caps (B complex vitamins) .... Take one tablet by mouth once a day 9)  Proventil Hfa 108 (90 Base) Mcg/act Aers (Albuterol sulfate) 10)  Triamcinolone Acetonide 0.5 % Oint (Triamcinolone acetonide) .... Apply two times a day to affected area. 11)  Citalopram Hydrobromide 20 Mg Tabs (Citalopram hydrobromide) .... One tablet by mouth daily 12)  Nystatin Powd (Nystatin) .... Apply twice daily beneath breasts and abdomen 13)  Clindamycin Phosphate 1 % Lotn (Clindamycin phosphate) .... Apply two times a day to affected are  on face. 14)  Coumadin 4 Mg Tabs (Warfarin sodium) .... Sunday - 4 mg, monday - 6 mg, tuesday - 4 mg, wednesday - 4 mg, thursday - 6 mg, friday - 4 mg, saturday - 4 mg 15)   Coumadin 1 Mg Tabs (Warfarin sodium) .... Sunday - 4 mg, monday - 6 mg, tuesday - 4 mg, wednesday - 4 mg, thursday - 6 mg, friday - 4 mg, saturday - 4 mg  Patient Instructions: 1)  We will call you with the referral to ENT.   2)  We will call you with the INR result for your coumadin.  Prescriptions: PROMETHAZINE HCL 25 MG TABS (PROMETHAZINE HCL) Take 1 tablet by mouth three times a day as needed nausea  #8 x 0   Entered and Authorized by:   Nyemah Watton MD   Signed by:   Novak Stgermaine MD on 12/07/2009   Method used:   Electronically to        [facility] [address] [id number]* (retail)       [address]       [place], [place]  [address]       Ph: [phone]       Fax: [phone]   RxID:   [id number] COUMADIN 4 MG TABS (WARFARIN SODIUM) Sunday - 4 mg, Monday - 6 mg, Tuesday - 4 mg, Wednesday - 4 mg, Thursday - 4 mg, Friday - 4 mg, Saturday - 4 mg  #90 x 0   Entered and Authorized by:   Marialuisa Basara MD   Signed by:   Kieth Hartis MD on 12/07/2009   Method used:   Printed then faxed to ...       [facility] [address] [id number]* (retail)       [address]       [place], [place]  [address]       Ph: [phone]       Fax: [phone]   RxID:   [id number] ATENOLOL 50 MG TABS (ATENOLOL) Take 1 tab by mouth once daily  #90 x 3   Entered and Authorized by:   Samrat Hayward MD   Signed by:   Wilbern Pennypacker MD on 12/07/2009   Method used:   Printed then faxed to ...       [facility] [address] [id number]* (retail)       [address] [place]  [address]       Ph: [phone]       Fax: [phone]   RxID:  [id number] VERAPAMIL HCL CR 360 MG XR24H-CAP (VERAPAMIL HCL) Take 1 cap by mouth once daily  #90 x 3   Entered and Authorized by:   Nani Gasser MD   Signed by:   Nani Gasser MD on 12/07/2009   Method used:   Printed then faxed to ...       [address] [phone]* (retail)       [address] [place], [place]  [address]       Ph:  [phone]       Fax: [phone]   RxID:   [id number] TRIAMCINOLONE ACETONIDE 0.5 % OINT (TRIAMCINOLONE ACETONIDE) Apply two times a day to affected area.  #40 grams x 0   Entered and Authorized by:   Nani Gasser MD   Signed by:   Nani Gasser MD on 12/07/2009   Method used:   Electronically to        [facility] [phone]* (retail)       [address] [place], [place]  [address]       Ph: [phone]       Fax: [phone]   RxID:   [id number] BETAMETHASONE DIPROPIONATE 0.05 % OINT (BETAMETHASONE DIPROPIONATE) Apply daily to affected are.  #40 grams. x 0   Entered and Authorized by:   Nani Gasser MD   Signed by:   Nani Gasser MD on 12/07/2009   Method used:   Electronically to        [facility] [phone]* (retail)       [address] [place], [place]  [address]       Ph: [phone]       Fax: [phone]   RxID:   [id number] CLINDAMYCIN PHOSPHATE 1 % LOTN (CLINDAMYCIN PHOSPHATE) Apply two times a day to affected are  on face.  #60 ml x 1   Entered and Authorized by:   Nani Gasser MD   Signed by:   Nani Gasser MD on 12/07/2009   Method used:   Electronically to        [facility] [phone]* (retail)       [address] [place], [place]  [address]       Ph: [phone]       Fax: [phone]   RxID:   [id number]    Orders Added: 1)  T- * Misc. Laboratory test [99999] 2)  ENT Referral [ENT] 3)  Est. Patient Level IV [38250] 4)  Tympanometry [53976]

## 2010-03-15 NOTE — Progress Notes (Signed)
Summary: pap smear questions  Phone Note Call from Patient Call back at [phone]   Caller: Patient Call For: Sandford Craze, NP Summary of Call: Pt called back wanting to speak with [facility] regarding her abnormal pap smear. Pt anxious and wants to know what  it means? Should she be concernced?  Nicki Guadalajara Fergerson CMA Duncan Dull)  August 23, 2009 11:45 AM   Follow-up for Phone Call        Called patient, reviewed pap and plans to follow up with GYN. Follow-up by: Lemont Fillers FNP,  August 23, 2009 12:02 PM

## 2010-03-15 NOTE — Assessment & Plan Note (Signed)
Summary: PT CHECK  Nurse Visit   Allergies: 1)  ! Lisinopril (Lisinopril) Laboratory Results   Blood Tests   Date/Time Received: 05/05/2009 Date/Time Reported: 05/05/2009  PT: 17.1 s   (Normal Range: 10.6-13.4)  INR: 1.9   (Normal Range: 0.88-1.12   Therap INR: 2.0-3.5)    Orders Added: 1)  Fingerstick [36416] 2)  Protime INR [16109]   Anticoagulation Management History:      The patient is on coumadin and comes in today for a routine follow up visit.  Her last INR was 1.4 and today's INR is 1.9.    Anticoagulation Management Assessment/Plan:      The target INR is 2.0-3.0.  She is to have a PT/INR in 1 week.  Anticoagulation instructions were given to patient.         Current Anticoagulation Instructions: The patient's dosage of coumadin will be increased.  The new dosage includes: Warfarin sodium 4 mg tabs: Take 1 tab by mouth once daily as directed Coumadin 4 mg tabs:  Sunday - 4 mg, Monday - 6 mg, Tuesday - 4 mg, Wednesday - 4 mg, Thursday - 6 mg, Friday - 4 mg, Saturday - 6 mg.  Repeat PT/INR in 1 week.

## 2010-03-15 NOTE — Assessment & Plan Note (Signed)
Summary: ACUTE   Vital Signs:  Patient profile:   61 year old female Height:      67 inches Weight:      227 pounds Pulse rate:   66 / minute BP sitting:   126 / 86 Cuff size:   large  Vitals Entered By: Andrea Cisneros (September 02, 2009 2:42 PM) CC: nause and wants GI referral   Primary Care Provider:  Nani Gasser MD  CC:  nause and wants GI referral.  History of Present Illness: [name] is a 60 year old female who presents today with complaint of nausea and periumbilical pain.  The patient noted that these symptoms started on 7/11.  Notes that this pain is similar to her history of peptic ulcers.  Had a gastroenterologist whom she saw back in [place].  +vomitting on 7/12.  No vomitting since but nausea is worse when she wakes up in the morning.  BM's have been 2-3 x a day, denies diarrhea.  Denies BRBPR or melena.   Current Medications (verified): 1)  Betamethasone Valerate 0.1 % Crea (Betamethasone Valerate) .... Apply Twice Daily To Affected Area 2)  Verapamil Hcl Cr 360 Mg Xr24h-Cap (Verapamil Hcl) .... Take 1 Cap By Mouth Once Daily 3)  Lyrica 100 Mg Caps (Pregabalin) .... Take 1 Cap By Mouth Three Times A Day 4)  Atenolol 50 Mg Tabs (Atenolol) .... Take 1 Tab By Mouth Once Daily 5)  Warfarin Sodium 4 Mg Tabs (Warfarin Sodium) .... Take 1 Tab By Mouth Once Daily As Directed 6)  Polyethylene Glycol 3350 .... Take 2 Times Daily As Directed As Needed 7)  Calcium 500/d 500-200 Mg-Unit Tabs (Calcium Carbonate-Vitamin D) .... Take One Tablet By Mouth Once A Day 8)  Vitamin B Complex  Caps (B Complex Vitamins) .... Take One Tablet By Mouth Once A Day 9)  Proventil Hfa 108 (90 Base) Mcg/act Aers (Albuterol Sulfate) 10)  Lyrica 100 Mg Caps (Pregabalin) .... Take One Tablet By Mouth Oncea D Ay 11)  Triamcinolone Acetonide 0.5 % Crea (Triamcinolone Acetonide) .... Appply Two Times A Day To Affected Area. 12)  Citalopram Hydrobromide 20 Mg Tabs (Citalopram Hydrobromide) .... One  Tablet By Mouth Daily 13)  Hydrocodone-Acetaminophen 5-500 Mg Tabs (Hydrocodone-Acetaminophen) .Marland Kitchen.. 1-2 Tab By Mouth Every 4-6 Hour As Needed 14)  Nystatin  Powd (Nystatin) .... Apply Twice Daily Beneath Breasts and Abdomen 15)  Warfarin Sodium 5 Mg Tabs (Warfarin Sodium) .... One Tablet By Mouth Daily  Allergies (verified): 1)  ! Lisinopril (Lisinopril)  Comments:  Nurse/Medical Assistant: The patient's medications and allergies were reviewed with the patient and were updated in the Medication and Allergy Lists. Andrea Cisneros (September 02, 2009 2:43 PM)  Physical Exam  General:  Well-developed,obese, AA female, in no acute distress; alert,appropriate and cooperative throughout examination Lungs:  Normal respiratory effort, chest expands symmetrically. Lungs are clear to auscultation, no crackles or wheezes. Heart:  Normal rate and regular rhythm. S1 and S2 normal without gallop, murmur, click, rub or other extra sounds. Abdomen:  non-tender, normal bowel sounds, no guarding, and no rigidity.  Some fullness noted on left side of abdomen.     Impression & Recommendations:  Problem # 1:  NAUSEA (ICD-787.02) Assessment Deteriorated Will check KUB to assess for stool burden.  Will also check LFT's, amylase, lipase and CBC.  If these studies are unremarkable, consider GI referal +/- CT of the abdomen.  Patient was instructed to call if her symptoms worsen or if she develops fever.  If severe abdominal pain, or intractable vomitting occur, she was instructed to go to the ER. Orders: KUB (KUB) T-Hepatic Function [phone]) T-Amylase [id number]) T-Lipase [id number]) T-CBC w/Diff ([id number]) T-CBC w/Diff ([id number])  Problem # 2:  ENCOUNTER FOR LONG-TERM USE OF ANTICOAGULANTS (ICD-V58.61) Assessment: Comment Only INR subtherapeutic.  Pt reports compliance.  Will change coumadin to 5mg  by mouth daily and have patient follow up in 1 week for follow up PT/INR  Complete Medication  List: 1)  Betamethasone Valerate 0.1 % Crea (Betamethasone valerate) .... Apply twice daily to affected area 2)  Verapamil Hcl Cr 360 Mg Xr24h-cap (Verapamil hcl) .... Take 1 cap by mouth once daily 3)  Lyrica 100 Mg Caps (Pregabalin) .... Take 1 cap by mouth three times a day 4)  Atenolol 50 Mg Tabs (Atenolol) .... Take 1 tab by mouth once daily 5)  Warfarin Sodium 4 Mg Tabs (Warfarin sodium) .... Take 1 tab by mouth once daily as directed 6)  Polyethylene Glycol 3350  .... Take 2 times daily as directed as needed 7)  Calcium 500/d 500-200 Mg-unit Tabs (Calcium carbonate-vitamin d) .... Take one tablet by mouth once a day 8)  Vitamin B Complex Caps (B complex vitamins) .... Take one tablet by mouth once a day 9)  Proventil Hfa 108 (90 Base) Mcg/act Aers (Albuterol sulfate) 10)  Lyrica 100 Mg Caps (Pregabalin) .... Take one tablet by mouth oncea d ay 11)  Triamcinolone Acetonide 0.5 % Crea (Triamcinolone acetonide) .... Appply two times a day to affected area. 12)  Citalopram Hydrobromide 20 Mg Tabs (Citalopram hydrobromide) .... One tablet by mouth daily 13)  Hydrocodone-acetaminophen 5-500 Mg Tabs (Hydrocodone-acetaminophen) .Marland Kitchen.. 1-2 tab by mouth every 4-6 hour as needed 14)  Nystatin Powd (Nystatin) .... Apply twice daily beneath breasts and abdomen 15)  Warfarin Sodium 5 Mg Tabs (Warfarin sodium) .... One tablet by mouth daily  Patient Instructions: 1)  Please follow up in 2 weeks for a visit. 2)  Follow up in 1 week for PT/INR.  3)  Complete your labs downstairs today. 4)  Complete your abdominal x-ray downstairs today.

## 2010-03-15 NOTE — Progress Notes (Signed)
  Phone Note Outgoing Call   Summary of Call: Could you pls try to obtain records from patient's dermatologist? Thanks Initial call taken by: Lemont Fillers FNP,  August 17, 2009 9:09 AM  Follow-up for Phone Call        Number busy   Additional Follow-up for Phone Call Additional follow up Details #1::        No answer at the above # and no machine to leave message. Additional Follow-up by: Payton Spark CMA,  August 20, 2009 4:31 PM    Additional Follow-up for Phone Call Additional follow up Details #2::    [facility] for pt to call back with name of derm so i can call and get records. Follow-up by: Kathlene November,  August 23, 2009 1:02 PM  Additional Follow-up for Phone Call Additional follow up Details #3:: Details for Additional Follow-up Action Taken: 08/25/2009- Have not heard back from this pt as of yet will file in chart for when she ever calls back with the information Additional Follow-up by: Kathlene November,  August 25, 2009 11:39 AM

## 2010-03-15 NOTE — Assessment & Plan Note (Signed)
Summary: PT CHECK  Nurse Visit   Allergies: 1)  ! Lisinopril (Lisinopril) Laboratory Results   Blood Tests   Date/Time Received: 9/03/16/09 Date/Time Reported: 10/15/09   INR: 2.2   (Normal Range: 0.88-1.12   Therap INR: 2.0-3.5)    Orders Added: 1)  Fingerstick [36416] 2)  Protime INR [[id number]]   Anticoagulation Management History:      The patient is on coumadin and comes in today for a routine follow up visit.  Her last INR was 3.5 and today's INR is 2.2.    Anticoagulation Management Assessment/Plan:      The target INR is 2.0-3.0.  She is to have a PT/INR in 2 weeks.  Anticoagulation instructions were given to patient.         Current Anticoagulation Instructions: The patient is to continue with the same dose of coumadin.  This dosage includes: Warfarin sodium 4 mg tabs: Take 1 tab by mouth once daily as directed Coumadin 4 mg tabs and Coumadin 1 mg tabs:  Sunday - 4 mg, Monday - 6 mg, Tuesday - 4 mg, Wednesday - 4 mg, Thursday - 4 mg, Friday - 4 mg, Saturday - 4 mg.  Repeat PT/INR in 2 weeks.  5:019/2/11 pt notifiedacm

## 2010-03-15 NOTE — Progress Notes (Signed)
  Phone Note Call from Patient   Caller: Patient Call For: Nani Gasser MD Summary of Call: Pt has called 4 times today wanting to know why we cannot use the Ct scan she recieved in the hospital back in June. pt has been told several time by myself and Selena Batten that the protocol that we need to follow is to get a current Ct to eval the cont nausea and elev amylase.CT has been secheduled an PA and pt cancelled appt.Spoke with pt again when she called again and pt wantd [place] to refer her to a "cancer doctor" because we are not taking her healthcare serious.Told the pt that i was sorry that she felt this way but we are doing the best we can to follow protocol to help address her healthcare issues. Told pt that she needs to get CT scan or make an appt to come in discuss further with doctor if she wanted the doc to personally speak with her. Pt then hung the phone up Initial call taken by: Avon Gully CMA, Duncan Dull),  September 10, 2009 3:28 PM  Follow-up for Phone Call        Pt was also offered a referral to GI but she declined. Will need to make an appt to discuss.  Follow-up by: Nani Gasser MD,  September 10, 2009 3:55 PM

## 2010-03-15 NOTE — Assessment & Plan Note (Signed)
Summary: PT  Nurse Visit   Allergies: 1)  ! Lisinopril (Lisinopril)  Orders Added: 1)  T-Protime, Auto [[id number]]

## 2010-03-15 NOTE — Letter (Signed)
Summary: Depression Questionnaire/[place] Andrea Cisneros  Depression Questionnaire/[place] Andrea Cisneros   Imported By: Lanelle Bal 05/04/2009 09:22:22  _____________________________________________________________________  External Attachment:    Type:   Image     Comment:   External Document

## 2010-03-15 NOTE — Progress Notes (Signed)
Summary: Pap Results  Phone Note Outgoing Call   Summary of Call: Please call patient and advise her that her Pap smear is abnormal.  She has an appointment with [facility] on 7/13. She should keep this appointment. Please fax copy of her Pap to office and confirm with nurse that this has been received.   Initial call taken by: Lemont Fillers FNP,  August 20, 2009 2:03 PM  Follow-up for Phone Call        attempted to contact patient at [phone], no answer, voice message left for patient to return call regarding papsmear results Follow-up by: Glendell Docker CMA,  August 20, 2009 2:13 PM  Additional Follow-up for Phone Call Additional follow up Details #1::        patient advised per [facility] instructions. Patient states that she had to reshedule appointment to August 11 @ 2:30 with Dr Norberto Sorenson due to her schedule. Patient is requesting a return call from [place]. She was advised that she is out of the office today and will return on Monday.  Additional Follow-up by: Glendell Docker CMA,  August 20, 2009 3:15 PM  New Problems: PAP SMEAR, ABNORMAL, ASCUS (ICD-795.01)   Additional Follow-up for Phone Call Additional follow up Details #2::    Tried to reach patient, left message for her to return my call. Follow-up by: Lemont Fillers FNP,  August 23, 2009 10:28 AM  New Problems: PAP SMEAR, ABNORMAL, ASCUS (ICD-795.01)

## 2010-03-15 NOTE — Assessment & Plan Note (Signed)
Summary: Abscess tooth   Vital Signs:  Patient profile:   60 year old female Height:      67 inches Weight:      239 pounds Temp:     98.3 degrees F oral Pulse rate:   69 / minute BP sitting:   127 / 85  (left arm) Cuff size:   regular  Vitals Entered By: Kathlene November (Jun 22, 2009 2:09 PM) CC: swollen left cheek from broken tooth   Primary Care Provider:  Nani Gasser MD  CC:  swollen left cheek from broken tooth.  History of Present Illness: swollen left cheek from broken tooth, started 5 days ago.  Called her dentist but her recommended go to PCP with her hx of blood clots.  Has been gargling with peroxide.  Broke her tooth on her left top jaw.  Now pain radiating from her left colalr bone to behind her left eye and temple. Feels the tooth is draining.   Anticoagulation Management History:      Her last INR was 2.8 and today's INR is 3.0.    Current Medications (verified): 1)  Betamethasone Dipropionate 0.05 % Oint (Betamethasone Dipropionate) .... Use As Directed As Needed 2)  Verapamil Hcl Cr 360 Mg Xr24h-Cap (Verapamil Hcl) .... Take 1 Cap By Mouth Once Daily 3)  Lyrica 100 Mg Caps (Pregabalin) .... Take 1 Cap By Mouth Three Times A Day 4)  Atenolol 50 Mg Tabs (Atenolol) .... Take 1 Tab By Mouth Once Daily 5)  Warfarin Sodium 4 Mg Tabs (Warfarin Sodium) .... Take 1 Tab By Mouth Once Daily As Directed 6)  Polyethylene Glycol 3350 .... Take 2 Times Daily As Directed As Needed 7)  Calcium 500/d 500-200 Mg-Unit Tabs (Calcium Carbonate-Vitamin D) .... Take One Tablet By Mouth Once A Day 8)  Vitamin B Complex  Caps (B Complex Vitamins) .... Take One Tablet By Mouth Once A Day 9)  Proventil Hfa 108 (90 Base) Mcg/act Aers (Albuterol Sulfate) 10)  Lyrica 100 Mg Caps (Pregabalin) .... Take One Tablet By Mouth Oncea D Ay 11)  Triamcinolone Acetonide 0.5 % Crea (Triamcinolone Acetonide) .... Appply Two Times A Day To Affected Area. 12)  Citalopram Hydrobromide 20 Mg Tabs  (Citalopram Hydrobromide) .... 1/2 By Mouth Once A Day For One Week, Then 1 Tab Daily 13)  Amoxicillin 500 Mg Caps (Amoxicillin) .... Take 1 Tablet By Mouth Three Times A Day For 10 Days 14)  Warfarin Sodium 6 Mg Tabs (Warfarin Sodium) .... Take As Directed.  Allergies (verified): 1)  ! Lisinopril (Lisinopril)  Comments:  Nurse/Medical Assistant: The patient's medications and allergies were reviewed with the patient and were updated in the Medication and Allergy Lists. Kathlene November (Jun 22, 2009 2:11 PM)  Physical Exam  General:  Well-developed,well-nourished,in no acute distress; alert,appropriate and cooperative throughout examination Head:  Left cheeck is very swollen.  She is very gtender over the cheek. No palpoable LN in the face or neck.  ON her upper left jaw has a tooth that is broken off at the gum line and is veyr decaryed. The surrounding gum is swollen but no active drainage.  Skin:  no rashes.   Cervical Nodes:  no anterior cervical adenopathy.     Impression & Recommendations:  Problem # 1:  ABSCESS, TOOTH (ICD-522.5)  Discussed tx. Will start clindamcyin for odontogenic infection and given hydrocodone for pain relief.  Needs to schedule appt with dentist for next Monday to have tooth extracted. Go to ED if gets  fever or pain gets worse.  She is on coumadin which may be challenging for the dental extraction. BUt current guidelines dont' recommend coming off of coumadin. Given IM rocephon acutely.   Orders: Rocephin  250mg  (U0454) Admin of Therapeutic Inj  intramuscular or subcutaneous (09811)  Complete Medication List: 1)  Betamethasone Dipropionate 0.05 % Oint (Betamethasone dipropionate) .... Use as directed as needed 2)  Verapamil Hcl Cr 360 Mg Xr24h-cap (Verapamil hcl) .... Take 1 cap by mouth once daily 3)  Lyrica 100 Mg Caps (Pregabalin) .... Take 1 cap by mouth three times a day 4)  Atenolol 50 Mg Tabs (Atenolol) .... Take 1 tab by mouth once daily 5)   Warfarin Sodium 4 Mg Tabs (Warfarin sodium) .... Take 1 tab by mouth once daily as directed 6)  Polyethylene Glycol 3350  .... Take 2 times daily as directed as needed 7)  Calcium 500/d 500-200 Mg-unit Tabs (Calcium carbonate-vitamin d) .... Take one tablet by mouth once a day 8)  Vitamin B Complex Caps (B complex vitamins) .... Take one tablet by mouth once a day 9)  Proventil Hfa 108 (90 Base) Mcg/act Aers (Albuterol sulfate) 10)  Lyrica 100 Mg Caps (Pregabalin) .... Take one tablet by mouth oncea d ay 11)  Triamcinolone Acetonide 0.5 % Crea (Triamcinolone acetonide) .... Appply two times a day to affected area. 12)  Citalopram Hydrobromide 20 Mg Tabs (Citalopram hydrobromide) .... 1/2 by mouth once a day for one week, then 1 tab daily 13)  Amoxicillin 500 Mg Caps (Amoxicillin) .... Take 1 tablet by mouth three times a day for 10 days 14)  Clindamycin Hcl 300 Mg Caps (Clindamycin hcl) .... Take 1 tablet by mouth four times a day for 10 days 15)  Hydrocodone-acetaminophen 5-500 Mg Tabs (Hydrocodone-acetaminophen) .Marland Kitchen.. 1-2 tab by mouth every 4-6 hour as needed 16)  Coumadin 4 Mg Tabs (Warfarin sodium) .... "Sunday - 4 mg, monday - 6 mg, tuesday - 4 mg, wednesday - 4 mg, thursday - 4 mg, friday - 4 mg, saturday - 6 mg  Other Orders: Fingerstick (36416) Protime INR (85610)  Anticoagulation Management Assessment/Plan:      The target INR is 2.0-3.0.  Anticoagulation instructions were given to patient.         Current Anticoagulation Instructions: The patient's dosage of coumadin will be decreased.  The new dosage includes: Warfarin sodium 4 mg tabs: Take 1 tab by mouth once daily as directed Coumadin 4 mg tabs:  Sunday - 4 mg, Monday - 6 mg, Tuesday - 4 mg, Wednesday - 4 mg, Thursday - 4 mg, Friday - 4 mg, Saturday - 6 mg.    Patient Instructions: 1)  Call you dentist and call to have an appointment for Monday. 2)  Start your antibiotic tonight or tomorrow morning.  3)  Can use the pain  medication sparingly.  If pain gets worse or get a fever then need to go to the Emergency Room.  Prescriptions: HYDROCODONE-ACETAMINOPHEN 5-500 MG TABS (HYDROCODONE-ACETAMINOPHEN) 1-2 tab by mouth every 4-6 hour as needed  #25 x 0   Entered and Authorized by:   Akio Hudnall MD   Signed by:   Dereonna Lensing MD on 06/22/2009   Method used:   Printed then faxed to ...       [facility] [address] [id number]* (retail)       [address], [place]  [address]       Ph: [phone]  Fax: [phone]   RxID:   [id number] CLINDAMYCIN HCL 300 MG CAPS (CLINDAMYCIN HCL) Take 1 tablet by mouth four times a day for 10 days  #40 x 0   Entered and Authorized by:   Nani Gasser MD   Signed by:   Nani Gasser MD on 06/22/2009   Method used:   Electronically to        [facility] [phone]* (retail)       [address] [place], [place]  [address]       Ph: [phone]       Fax: [phone]   RxID:   [id number]    Laboratory Results   Blood Tests   Date/Time Received: 06/22/2009 Date/Time Reported: 06/22/2009  PT: 21.0 s   (Normal Range: 10.6-13.4)  INR: 3.0   (Normal Range: 0.88-1.12   Therap INR: 2.0-3.5)      Medication Administration  Injection # 1:    Medication: Rocephin  250mg     Diagnosis: ABSCESS, TOOTH (ICD-522.5)    Route: IM    Site: LUOQ gluteus    Exp Date: 01/14/2012    Lot #: [id number]    Mfr: novaplus    Comments: Rocephin 1gm    Patient tolerated injection without complications    Given by: Kathlene November (Jun 22, 2009 2:38 PM)  Orders Added: 1)  Est. Patient Level IV [32951] 2)  Fingerstick [36416] 3)  Protime INR [85610] 4)  Rocephin  250mg  [J0696] 5)  Admin of Therapeutic Inj  intramuscular or subcutaneous [88416]

## 2010-03-15 NOTE — Therapy (Signed)
Summary: Tympanogram/[facility]  Tympanogram/[facility]   Imported By: Sherian Rein 12/14/2009 14:13:52  _____________________________________________________________________  External Attachment:    Type:   Image     Comment:   External Document

## 2010-03-15 NOTE — Assessment & Plan Note (Signed)
Summary: PT CHECK//VGJ  Nurse Visit   Vitals Entered By: Payton Spark CMA (September 02, 2009 2:11 PM)  Allergies: 1)  ! Lisinopril (Lisinopril) Laboratory Results   Blood Tests      INR: 1.5   (Normal Range: 0.88-1.12   Therap INR: 2.0-3.5)    Orders Added: 1)  Fingerstick [36416] 2)  Protime [54098JX] Prescriptions: WARFARIN SODIUM 5 MG TABS (WARFARIN SODIUM) one tablet by mouth daily  #30 x 0   Entered and Authorized by:   Lemont Fillers FNP   Signed by:   Lemont Fillers FNP on 09/02/2009   Method used:   Electronically to        [facility] [phone]* (retail)       [address] [place], [place]  [address]       Ph: [phone]       Fax: [phone]   RxID:   [id number]

## 2010-03-15 NOTE — Assessment & Plan Note (Signed)
Summary: PT CHECK  Nurse Visit   Allergies: 1)  ! Lisinopril (Lisinopril) Laboratory Results   Blood Tests   Date/Time Received: 10/08/2009 Date/Time Reported: 10/08/2009   INR: 3.5   (Normal Range: 0.88-1.12   Therap INR: 2.0-3.5) Comments: Pt on Coumadin 5mg  everyday.  States has been on 4mg  last 2 days and did not take any last night because she ran out    Orders Added: 1)  Fingerstick [36416] 2)  Protime [09811BJ]   Anticoagulation Management History:      The patient is on coumadin and comes in today for a routine follow up visit.  Her last INR was 1.5 and today's INR is 3.5.    Anticoagulation Management Assessment/Plan:      The target INR is 2.0-3.0.  She is to have a PT/INR in 1 week.  Anticoagulation instructions were given to patient.         Current Anticoagulation Instructions: The patient is to hold (do not take) the Friday dose of coumadin.  The dosage to be resumed includes: Warfarin sodium 4 mg tabs: Take 1 tab by mouth once daily as directed Coumadin 4 mg tabs and Coumadin 1 mg tabs:  Sunday - 4 mg, Monday - 6 mg, Tuesday - 4 mg, Wednesday - 4 mg, Thursday - 4 mg, Friday - 4 mg, Saturday - 4 mg.  Repeat PT/INR in 1 week.

## 2010-03-15 NOTE — Progress Notes (Signed)
Summary: CALL A NURSE  Phone Note From Other Clinic   Caller: CALL A NURSE Summary of Call: [facility] Triage Call Report Triage Record Num: [id number] Operator: Albertine Grates Patient Name: Andrea Cisneros Call Date & Time: 12/07/2009 5:21:17PM Patient Phone: [phone] PCP: Nani Gasser Patient Gender: Female PCP Fax : [phone] Patient DOB: 1950/10/07 Practice Name: [facility] Reason for Call: [facility] calling and pt. had PT/17.2 and INR/1.4 drawn 10-25. Pt. notified and takes Coumadin 4mg  daily except Mondays and takes 6mg  on Mondays. Denies bleeding. Dr. Debby Bud notified and advised to continue same dose 10-25 and call office 10-26. Initial call taken by: Payton Spark CMA,  December 08, 2009 8:16 AM

## 2010-03-15 NOTE — Progress Notes (Signed)
  Phone Note Outgoing Call   Summary of Call: Please call patient and let her know that I would like for her to have a CT of her abdomen and pelvis to rule out any pancreatitis of other abdominal problems.  She will need to have a BMET completed first so that we have a creatinine on file.  I have ordered the CT and the Creatinine in EMR. Initial call taken by: Lemont Fillers FNP,  September 03, 2009 9:02 PM  Follow-up for Phone Call        Pt notified and CT scheduled for 7/28 in [place] Follow-up by: Kathlene November,  September 08, 2009 2:00 PM

## 2010-03-15 NOTE — Assessment & Plan Note (Signed)
Summary: PT CHECK  Nurse Visit   Vital Signs:  Patient profile:   60 year old female O2 Sat:      97 % on Room air  Vitals Entered By: Payton Spark CMA (August 05, 2009 12:04 PM)  O2 Flow:  Room air  Allergies: 1)  ! Lisinopril (Lisinopril) Laboratory Results   Blood Tests      INR: 2.1   (Normal Range: 0.88-1.12   Therap INR: 2.0-3.5)    Orders Added: 1)  Fingerstick [36416] 2)  Protime INR [04540]   Anticoagulation Management History:      Her last INR was 3.0 and today's INR is 2.1.    Anticoagulation Management Assessment/Plan:      The target INR is 2.0-3.0.  She is to have a PT/INR in 4 weeks.  Anticoagulation instructions were given to patient.         Current Anticoagulation Instructions: The patient is to continue with the same dose of coumadin.  This dosage includes:   Warfarin sodium 4 mg tabs: Take 1 tab by mouth once daily as directed Coumadin 4 mg tabs and Coumadin 1 mg tabs:  Sunday - 4 mg, Monday - 6 mg, Tuesday - 4 mg, Wednesday - 4 mg, Thursday - 4 mg, Friday - 4 mg, Saturday - 6 mg.    Repeat PT/INR in 4 weeks.     Appended Document: PT CHECK Pt aware and has CPE scheduled w/ Melissa next week.

## 2010-03-15 NOTE — Progress Notes (Signed)
Summary: GI referral  Phone Note Call from Patient   Caller: Patient Call For: Sandford Craze Summary of Call: Pt would like a referral to GI for nausea. States she had them every year in [place]. Please put in referral. Initial call taken by: Kathlene November,  September 02, 2009 2:22 PM

## 2010-03-17 NOTE — Assessment & Plan Note (Signed)
Summary: INR CHK'D  Nurse Visit   Allergies: 1)  ! Lisinopril (Lisinopril) Laboratory Results   Blood Tests   Date/Time Received: 02/28/10 Date/Time Reported: 02/28/10   INR: 2.6   (Normal Range: 0.88-1.12   Therap INR: 2.0-3.5)    Orders Added: 1)  Fingerstick [36416] 2)  Protime INR [85610]   Anticoagulation Management History:      The patient is on coumadin and comes in today for a routine follow up visit.  Her last INR was 2.2 and today's INR is 2.6.    Anticoagulation Management Assessment/Plan:      The target INR is 2.0-3.0.  She is to have a PT/INR in 4 weeks.         Current Anticoagulation Instructions: The patient is to continue with the same dose of coumadin.  This dosage includes: Warfarin sodium 4 mg tabs: Take 1 tab by mouth once daily as directed Coumadin 4 mg tabs and Coumadin 1 mg tabs:  Sunday - 4 mg, Monday - 6 mg, Tuesday - 4 mg, Wednesday - 4 mg, Thursday - 6 mg, Friday - 4 mg, Saturday - 4 mg.  Repeat PT/INR in 4 weeks.

## 2010-03-17 NOTE — Consult Note (Signed)
Summary: [facility]  [facility]   Imported By: Lanelle Bal 02/17/2010 11:09:02  _____________________________________________________________________  External Attachment:    Type:   Image     Comment:   External Document

## 2010-03-17 NOTE — Assessment & Plan Note (Signed)
Summary: atopic dermatitis   Vital Signs:  Patient profile:   60 year old female old female Height:      67 inches Weight:      222 pounds BMI:     34.90 O2 Sat:      96 % on Room air Pulse rate:   96 / minute BP sitting:   140 / 96  (left arm) Cuff size:   large  Vitals Entered By: Payton Spark CMA (January 28, 2010 3:02 PM)  O2 Flow:  Room air CC: Rash on face and chest x months. Current meds not helping.    Primary Care Provider:  Nani Gasser MD  CC:  Rash on face and chest x months. Current meds not helping. Andrea Cisneros  History of Present Illness: 60 yo AAF with hx of atopic dermatitis presents for a full body itchy rash x 1 month.  Ran out of Cutivate (given samples by her dermatologist).  She is using [employer] in the shower, dye free laundry soap but has been using dryer sheets.  Not using anything topically.  Has dry skin.  Feels like her face looks darker now.    Anticoagulation Management History:      The patient is on coumadin and comes in today for a routine follow up visit.  Her last INR was 1.43 and today's INR is 2.2.    Current Medications (verified): 1)  Betamethasone Dipropionate 0.05 % Oint (Betamethasone Dipropionate) .... Apply Daily To Affected Are. 2)  Verapamil Hcl Cr 360 Mg Xr24h-Cap (Verapamil Hcl) .... Take 1 Cap By Mouth Once Daily 3)  Lyrica 100 Mg Caps (Pregabalin) .... Take 1 Cap By Mouth Three Times A Day 4)  Atenolol 50 Mg Tabs (Atenolol) .... Take 1 Tab By Mouth Once Daily 5)  Warfarin Sodium 4 Mg Tabs (Warfarin Sodium) .... Take 1 Tab By Mouth Once Daily As Directed 6)  Polyethylene Glycol 3350 .... Take 2 Times Daily As Directed As Needed 7)  Calcium 500/d 500-200 Mg-Unit Tabs (Calcium Carbonate-Vitamin D) .... Take One Tablet By Mouth Once A Day 8)  Vitamin B Complex  Caps (B Complex Vitamins) .... Take One Tablet By Mouth Once A Day 9)  Proventil Hfa 108 (90 Base) Mcg/act Aers (Albuterol Sulfate) 10)  Triamcinolone Acetonide 0.5 % Oint  (Triamcinolone Acetonide) .... Apply Two Times A Day To Affected Area. 11)  Citalopram Hydrobromide 20 Mg Tabs (Citalopram Hydrobromide) .... One Tablet By Mouth Daily 12)  Nystatin  Powd (Nystatin) .... Apply Twice Daily Beneath Breasts and Abdomen 13)  Clindamycin Phosphate 1 % Lotn (Clindamycin Phosphate) .... Apply Two Times A Day To Affected Are  On Face. 14)  Coumadin 4 Mg Tabs (Warfarin Sodium) .... Sunday - 4 Mg, Monday - 6 Mg, Tuesday - 4 Mg, Wednesday - 4 Mg, Thursday - 6 Mg, Friday - 4 Mg, Saturday - 4 Mg 15)  Coumadin 1 Mg Tabs (Warfarin Sodium) .... Sunday - 4 Mg, Monday - 6 Mg, Tuesday - 4 Mg, Wednesday - 4 Mg, Thursday - 6 Mg, Friday - 4 Mg, Saturday - 4 Mg  Allergies (verified): 1)  ! Lisinopril (Lisinopril)  Past History:  Past Medical History: Reviewed history from 05/11/2009 and no changes required. 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 Estella Husk  Past Surgical History: Reviewed history from 04/29/2009 and no changes required. Partial colectomy for diverticulitis.  Tubal ligation Right arm  Gastric bypass > 20 years   Social History:  Reviewed history from 04/29/2009 and no changes required. Disabled. BA degree. Has 2 sons. Single.  Current Smoker Alcohol use-yes Drug use-no Regular exercise-no 2 caffeinated drinks per day.   Review of Systems      See HPI  Physical Exam  General:  alert, well-developed, well-nourished, and well-hydrated.   Head:  normocephalic and atraumatic.   Mouth:  pharynx pink and moist.   Neck:  no masses.   Lungs:  Normal respiratory effort, chest expands symmetrically. Lungs are clear to auscultation, no crackles or wheezes. Heart:  Normal rate and regular rhythm. S1 and S2 normal without gallop, murmur, click, rub or other extra sounds. Skin:  hyperpigemented skin on face with tiny bumps.  No erythema.  Diffuse xerosis with maculopapular rash on race, chest and extensor surfaces of arms and  legs Cervical Nodes:  No lymphadenopathy noted   Impression & Recommendations:  Problem # 1:  ECZEMA (ICD-692.9) Assessment Deteriorated Flare up.  Will treat with oral Prednisone pack x 6 days + start of Cutivate once daily for up to 4 wks.  Reviewed maintence plan for eczema.  Call if not improved after 10 days. Her updated medication list for this problem includes:    Betamethasone Dipropionate 0.05 % Oint (Betamethasone dipropionate) .Andrea Cisneros... Apply daily to affected are.    Triamcinolone Acetonide 0.5 % Oint (Triamcinolone acetonide) .Andrea Cisneros... Apply two times a day to affected area.    Cutivate 0.05 % Lotn (Fluticasone propionate) .Andrea Cisneros... Apply lotion to eczema rash once a day for up to 4 wks.    Prednisone (pak) 10 Mg Tabs (Prednisone) .Andrea Cisneros... Take for 6 days as directed  Problem # 2:  PULMONARY EMBOLISM (ICD-415.19) pt was OVERDUE for coumadin check today.  AT goal.  RTC 4 wks for f/u. The following medications were removed from the medication list:    Coumadin 4 Mg Tabs (Warfarin sodium) ..... Sunday - 4 mg, monday - 6 mg, tuesday - 4 mg, wednesday - 4 mg, thursday - 6 mg, friday - 4 mg, saturday - 4 mg    Coumadin 1 Mg Tabs (Warfarin sodium) ..... Sunday - 4 mg, monday - 6 mg, tuesday - 4 mg, wednesday - 4 mg, thursday - 6 mg, friday - 4 mg, saturday - 4 mg Her updated medication list for this problem includes:    Warfarin Sodium 4 Mg Tabs (Warfarin sodium) .Andrea Cisneros... Take 1 tab by mouth once daily as directed    Coumadin 4 Mg Tabs (Warfarin sodium) ..... Sunday - 4 mg, monday - 6 mg, tuesday - 4 mg, wednesday - 4 mg, thursday - 6 mg, friday - 4 mg, saturday - 4 mg    Coumadin 1 Mg Tabs (Warfarin sodium) ..... Sunday - 4 mg, monday - 6 mg, tuesday - 4 mg, wednesday - 4 mg, thursday - 6 mg, friday - 4 mg, saturday - 4 mg  Orders: Fingerstick (16109) Protime ([id number])  Complete Medication List: 1)  Betamethasone Dipropionate 0.05 % Oint (Betamethasone dipropionate) .... Apply daily to affected  are. 2)  Verapamil Hcl Cr 360 Mg Xr24h-cap (Verapamil hcl) .... Take 1 cap by mouth once daily 3)  Lyrica 100 Mg Caps (Pregabalin) .... Take 1 cap by mouth three times a day 4)  Atenolol 50 Mg Tabs (Atenolol) .... Take 1 tab by mouth once daily 5)  Warfarin Sodium 4 Mg Tabs (Warfarin sodium) .... Take 1 tab by mouth once daily as directed 6)  Polyethylene Glycol 3350  .... Take 2 times daily as  directed as needed 7)  Calcium 500/d 500-200 Mg-unit Tabs (Calcium carbonate-vitamin d) .... Take one tablet by mouth once a day 8)  Vitamin B Complex Caps (B complex vitamins) .... Take one tablet by mouth once a day 9)  Proventil Hfa 108 (90 Base) Mcg/act Aers (Albuterol sulfate) 10)  Triamcinolone Acetonide 0.5 % Oint (Triamcinolone acetonide) .... Apply two times a day to affected area. 11)  Citalopram Hydrobromide 20 Mg Tabs (Citalopram hydrobromide) .... One tablet by mouth daily 12)  Nystatin Powd (Nystatin) .... Apply twice daily beneath breasts and abdomen 13)  Clindamycin Phosphate 1 % Lotn (Clindamycin phosphate) .... Apply two times a day to affected are  on face. 14)  Coumadin 4 Mg Tabs (Warfarin sodium) .... Sunday - 4 mg, monday - 6 mg, tuesday - 4 mg, wednesday - 4 mg, thursday - 6 mg, friday - 4 mg, saturday - 4 mg 15)  Coumadin 1 Mg Tabs (Warfarin sodium) .... Sunday - 4 mg, monday - 6 mg, tuesday - 4 mg, wednesday - 4 mg, thursday - 6 mg, friday - 4 mg, saturday - 4 mg 16)  Cutivate 0.05 % Lotn (Fluticasone propionate) .... Apply lotion to eczema rash once a day for up to 4 wks. 17)  Prednisone (pak) 10 Mg Tabs (Prednisone) .... Take for 6 days as directed  Anticoagulation Management Assessment/Plan:      The target INR is 2.0-3.0.  She is to have a PT/INR in 4 weeks.  Anticoagulation instructions were given to patient.         Current Anticoagulation Instructions: The patient is to continue with the same dose of coumadin.  This dosage includes:   Warfarin sodium 4 mg tabs: Take 1  tab by mouth once daily as directed Coumadin 4 mg tabs and Coumadin 1 mg tabs:  Sunday - 4 mg, Monday - 6 mg, Tuesday - 4 mg, Wednesday - 4 mg, Thursday - 6 mg, Friday - 4 mg, Saturday - 4 mg.    Repeat PT/INR in 4 weeks.    Patient Instructions: 1)  For eczema flare up: 2)  Take 6 days of Prednisone taper pack. 3)  Use RX cutivate lotion -- apply in the morning. 4)  Use OTC Aquaphor or Vasoline at nightttime to moisturize skin. 5)  Continue use of current soap but stop use of dryer sheets. 6)  Call if not improved after 7-10 days. Prescriptions: PREDNISONE (PAK) 10 MG TABS (PREDNISONE) take for 6 days as directed  #1 pack x 0   Entered and Authorized by:   Karen Bowen DO   Signed by:   Karen Bowen DO on 01/28/2010   Method used:   Electronically to        [facility] [address] [id number]* (retail)       [address]       [place], [place]  [address]       Ph: [phone]       Fax: [phone]   RxID:   [id number] CUTIVATE 0.05 % LOTN (FLUTICASONE PROPIONATE) apply lotion to eczema rash once a day for up to 4 wks.  #120 ml x 1   Entered and Authorized by:   Karen Bowen DO   Signed by:   Karen Bowen DO on 01/28/2010   Method used:   Electronically to        [facility] [address] [id number]* (retail)       [address]  [place], [place]  [address]       Ph: [phone]       Fax: [phone]   RxID:   [id number]    Orders Added: 1)  Fingerstick [36416] 2)  Protime [40102VO] 3)  Est. Patient Level III [53664]    Laboratory Results   Blood Tests      INR: 2.2   (Normal Range: 0.88-1.12   Therap INR: 2.0-3.5)

## 2010-03-17 NOTE — Progress Notes (Signed)
Summary: Lotion too expensive  Phone Note Call from Patient Call back at [facility] Phone [phone]   Caller: Patient Call For: Reata Petrov Summary of Call: Pt calls and states the lotion you gave her on Friday is way too expensive for her and wants to know if you can send a generic lotion that would work. Send to [facility] in [place] Initial call taken by: Kathlene November LPN,  January 31, 2010 3:38 PM    New/Updated Medications: HYDROCORTISONE 2.5 % CREA (HYDROCORTISONE) apply to rash twice daily x 10-14 days as needed Prescriptions: HYDROCORTISONE 2.5 % CREA (HYDROCORTISONE) apply to rash twice daily x 10-14 days as needed  #30 g x 1   Entered and Authorized by:   Seymour Bars DO   Signed by:   Seymour Bars DO on 01/31/2010   Method used:   Electronically to        [facility] [phone]* (retail)       [address] [place], [place]  [address]       Ph: [phone]       Fax: [phone]   RxID:   [id number]   Appended Document: Lotion too expensive 01/31/2010- Pt notifeid different med sent to [facility]. KJ LPN

## 2010-03-29 ENCOUNTER — Ambulatory Visit: Payer: Self-pay

## 2010-05-12 ENCOUNTER — Encounter: Payer: Self-pay | Admitting: Family Medicine

## 2010-05-16 ENCOUNTER — Ambulatory Visit (INDEPENDENT_AMBULATORY_CARE_PROVIDER_SITE_OTHER): Payer: 59 | Admitting: Family Medicine

## 2010-05-16 ENCOUNTER — Encounter: Payer: Self-pay | Admitting: Family Medicine

## 2010-05-16 ENCOUNTER — Ambulatory Visit: Payer: Self-pay | Admitting: Family Medicine

## 2010-05-16 DIAGNOSIS — I2699 Other pulmonary embolism without acute cor pulmonale: Secondary | ICD-10-CM

## 2010-05-16 DIAGNOSIS — F329 Major depressive disorder, single episode, unspecified: Secondary | ICD-10-CM

## 2010-05-16 DIAGNOSIS — L259 Unspecified contact dermatitis, unspecified cause: Secondary | ICD-10-CM

## 2010-05-16 LAB — POCT INR: INR: 2.7

## 2010-05-16 MED ORDER — DIAZEPAM 2 MG PO TABS: 2.0000 mg | ORAL_TABLET | Freq: Two times a day (BID) | ORAL | Status: DC | PRN

## 2010-05-16 MED ORDER — CITALOPRAM HYDROBROMIDE 20 MG PO TABS: 20.0000 mg | ORAL_TABLET | Freq: Every day | ORAL | Status: DC

## 2010-05-16 NOTE — Patient Instructions (Signed)
Recheck coumadin level in 3-4 weeks at follow up for your moods Let me know if you decide you want to do counseling.

## 2010-05-16 NOTE — Progress Notes (Signed)
  Subjective:    Patient ID: Andrea Cisneros, female    DOB: 1950/06/29, 60 y.o.   MRN: [id number]  HPI Feeling more nervousl.  Feels like she needs to restart her valium. Worse when she drives. Can stand to hear a car horn.  She is losing her hair.  Keep falling.  No other personal stressors.  She really needs bilat. Knee replacements, but she can't afford it at this time.  She is trying to take a class and she can't focus. She feels her mood has been down for about 3 months but did get worse a month ago when she stopped her citalopram.  She would like to restart valium which she has used for her mood before. She feels like a failure in life. She denies any suicidal thoughts. She doesn't want to burden her family.She says she is breaking out in hives on her face and chest when she gets really anxious.  Wants to inc he strength on her steroid cream.    Review of Systems     Objective:   Physical Exam  Constitutional: She appears well-developed and well-nourished.  HENT:  Head: Normocephalic.  Cardiovascular: Normal rate, regular rhythm and normal heart sounds.   Pulmonary/Chest: Effort normal and breath sounds normal.       BP 136/90  Pulse 69  Ht 5\' 7"  (1.702 m)  Wt 225 lb (102.059 kg)  BMI 35.24 kg/m2    Assessment & Plan:

## 2010-05-16 NOTE — Assessment & Plan Note (Signed)
Last inr in Jan. She is way overdue. Recheck today is well controlled. Recheck at next OV n 3-4 weeks.

## 2010-05-16 NOTE — Assessment & Plan Note (Addendum)
PHQ- 9 score of 22 today. She stopped her citalopram about  A month ago. Ran out. Restart med and restart valium which she used to take.  Denies suicidal thoughts.  F/U in 3-4 weeks. She would like to hold off on counseling for now.

## 2010-05-16 NOTE — Assessment & Plan Note (Signed)
Let her know the betamethasone doesn't come in a higher strength. Will focus on getting her mentally better adn see if rash and ahir growth is better.

## 2010-05-18 ENCOUNTER — Other Ambulatory Visit: Payer: Self-pay | Admitting: *Deleted

## 2010-05-18 NOTE — Telephone Encounter (Signed)
Pt states she has been to [facility] in [place] and Citalopram and Valium rx's are not there.  Also pt states she needs refills on her meds from [facility].  I am unsure if this is mail order or some other type of program.  Pt does not have mail order pharm on file in [facility] or [facility].

## 2010-05-19 ENCOUNTER — Other Ambulatory Visit: Payer: Self-pay | Admitting: *Deleted

## 2010-05-19 MED ORDER — ATENOLOL 50 MG PO TABS: 50.0000 mg | ORAL_TABLET | Freq: Every day | ORAL | Status: DC

## 2010-05-19 MED ORDER — VERAPAMIL HCL ER 360 MG PO CP24: 360.0000 mg | ORAL_CAPSULE | Freq: Every day | ORAL | Status: DC

## 2010-05-19 NOTE — Telephone Encounter (Signed)
Called pt and sent in  30 day supplies of both bP meds  To [facility] and called in valium.pt states she will wait until she comes in on the 23 for her nv to order meds from mail order pharm

## 2010-06-06 ENCOUNTER — Ambulatory Visit: Payer: 59

## 2010-06-20 ENCOUNTER — Ambulatory Visit: Payer: 59 | Admitting: Family Medicine

## 2010-06-23 ENCOUNTER — Ambulatory Visit: Payer: Self-pay | Admitting: Family Medicine

## 2010-06-23 ENCOUNTER — Ambulatory Visit (INDEPENDENT_AMBULATORY_CARE_PROVIDER_SITE_OTHER): Payer: 59 | Admitting: Family Medicine

## 2010-06-23 ENCOUNTER — Encounter: Payer: Self-pay | Admitting: Family Medicine

## 2010-06-23 VITALS — BP 158/96 | HR 81 | Ht 67.0 in | Wt 223.0 lb

## 2010-06-23 DIAGNOSIS — IMO0001 Reserved for inherently not codable concepts without codable children: Secondary | ICD-10-CM

## 2010-06-23 DIAGNOSIS — F329 Major depressive disorder, single episode, unspecified: Secondary | ICD-10-CM

## 2010-06-23 DIAGNOSIS — I2699 Other pulmonary embolism without acute cor pulmonale: Secondary | ICD-10-CM

## 2010-06-23 DIAGNOSIS — M797 Fibromyalgia: Secondary | ICD-10-CM

## 2010-06-23 DIAGNOSIS — F3289 Other specified depressive episodes: Secondary | ICD-10-CM

## 2010-06-23 LAB — POCT INR: INR: 1.2

## 2010-06-23 MED ORDER — PREGABALIN 75 MG PO CAPS: 75.0000 mg | ORAL_CAPSULE | Freq: Two times a day (BID) | ORAL | Status: DC

## 2010-06-23 MED ORDER — DIAZEPAM 2 MG PO TABS: 2.0000 mg | ORAL_TABLET | Freq: Three times a day (TID) | ORAL | Status: DC | PRN

## 2010-06-23 MED ORDER — BETAMETHASONE DIPROPIONATE 0.05 % EX OINT: TOPICAL_OINTMENT | CUTANEOUS | Status: DC

## 2010-06-23 NOTE — Assessment & Plan Note (Signed)
Restart her lyrica. Coupon card given.  F/U in one month for a dose adjustment. She says sher joints and muscles felt much better on the medication.

## 2010-06-23 NOTE — Progress Notes (Signed)
  Subjective:    Patient ID: Andrea Cisneros, female    DOB: 1950-07-12, 60 y.o.   MRN: [id number]  HPI Feels her like her fibromyalgia is out of control.  She would like to restart her lyrica as she felt better on this. Initally stopped it because of the cost and stomach upset but says she really wants to restart it now.  She says she can really tell the difference off of it.    She was given valium she could use bid.  Says the valium tid worked better for her. Says it helped her feel better overall. She used to take a much higher dose than this.  Says hard to sit for longer than 20 min at a time because her back and joints will start hurting. She stilll feels down but feels overall a little better.    Review of Systems     Objective:   Physical Exam  Constitutional: She is oriented to person, place, and time. She appears well-developed and well-nourished.  Cardiovascular: Normal rate, regular rhythm and normal heart sounds.   Pulmonary/Chest: Effort normal and breath sounds normal.  Neurological: She is alert and oriented to person, place, and time.  Skin: Skin is warm and dry.  Psychiatric: She has a normal mood and affect. Her behavior is normal. Judgment and thought content normal.          Assessment & Plan:  See anticoag note for coumadin adjustment. She says she skipped a couple of doses.

## 2010-06-23 NOTE — Assessment & Plan Note (Addendum)
PHQ-9 is 18.  She is still not doing well. Will inc valium to tid. Will restart her lyrica. F/u in onemonth. If not at goal can add a mood stabilizer since higher doses of citalpram can cause QT prolongation. I reminded her about potential dependence on the valium so will dec her quantity at her f/u

## 2010-06-28 NOTE — Assessment & Plan Note (Signed)
NAME:  Andrea Cisneros, Andrea Cisneros NO.:  [id number]   MEDICAL RECORD NO.:  [id number]          PATIENT TYPE:  POB   LOCATION:  [facility] at [place]         FACILITY:  [facility]   PHYSICIAN:  Allie Bossier, MD        DATE OF BIRTH:  [birth year]   DATE OF SERVICE:  09/14/2009                                  CLINIC NOTE   Ms. Towell is a 60 year old lady who was seen by Dr. Shawnie Pons on September 01, 2009.  She was seen by her as a referral because in June this patient  with a known fibroid uterus had a little bit of light pinkness when she  wiped after urinating.  She has been menopausal for about 3 years.  An  ultrasound with CT confirmation showed a 7 x 7 x 3-cm fibroid uterus  which only had a 3 mm endometrial stripe.  Dr. Shawnie Pons attempted that an  endometrial biopsy; however, the patient could not tolerate it.  She  gave her a prescription for Cytotec.  The patient has taken her Cytotec  and returns here for a repeat attempt at endometrial biopsy.   On exam, I prepped her cervix with Betadine and grasped the anterior lip  of the cervix with a single-tooth tenaculum.  Cervix was somewhat  stenotic, but I was able to place a Silastic dilator and was able to  insert the Pipelle to about 6 cm at which point, the patient declared  that she could not stand the discomfort and said she preferred to not  have this test done.  I have therefore recommended that we repeat her  ultrasound in December of this year (since her uterine lining was only 3  mm) and that she should return should she have more bleeding.      Allie Bossier, MD     MCD/MEDQ  D:  09/14/2009  T:  09/15/2009  Job:  [id number]

## 2010-06-28 NOTE — Assessment & Plan Note (Signed)
NAME:  Andrea Cisneros, Andrea Cisneros NO.:  [id number]   MEDICAL RECORD NO.:  [id number]          PATIENT TYPE:  POB   LOCATION:  [facility] [place]         FACILITY:  [facility]   PHYSICIAN:  Tinnie Gens, MD        DATE OF BIRTH:  [birth year]   DATE OF SERVICE:                                  CLINIC NOTE   CHIEF COMPLAINT:  Uterine fibroids.   HISTORY OF PRESENT ILLNESS:  The patient is a 60 year old gravida 4,  para 2-0-2-2 who was referred by her primary care physician to this  office for evaluation of abnormal Pap smear.  Additionally, the patient  has had to 6-week history of nausea and abdominal pain.  She has a  history of pancreatitis in the past and thought maybe she was having  another bout of this.  She has been unable to lie flat, but she reports  her abdominal pain is low.  She was seen in the ED where they did a CT  scan that showed fibroid uterus and so then she had a pelvic sonogram  which additionally showed fibroid uterus.  The patient has a known  history of fibroid uterus fairly good while ago.  She has been  menopausal for approximately 3 years, but she has pink spotting on a  monthly basis.  Her pelvic sonogram revealed a 7- x 7- x 3-cm fibroid  uterus which is fairly small and a 3-mm endometrial stripe which is very  thin and unlikely to be anything of note.  Additionally, the patient had  an abnormal Pap smear that was ASCUS; however, her HPV was negative.   PAST MEDICAL HISTORY:  Significant for arthritis, GERD, irritable bowel,  hypertension.  Blood clots in her legs.  PE, low white blood cell count,  anemia, and fibromyalgia.   PAST SURGICAL HISTORY:  Gastric bypass, partial colectomy for  diverticulitis and diverticulosis, removal of a tube and ovary secondary  to ectopic pregnancy and a tubal ligation.   ALLERGIES:  LISINOPRIL.   MEDICATIONS:  Verapamil 360 mg p.o. daily, warfarin 4 mg p.o. daily,  betamethasone 0.1% to the affected area as needed,  Lyrica 100 mg t.i.d.,  polyethylene glycol as needed, calcium 500 mg b.i.d., vitamin B complex,  Proventil HFA p.r.n., triamcinolone 0.5% as needed, citalopram 20 mg  p.o. daily, Vicodin as needed, warfarin 1 tab 4 mg and 6 mg both  alternate every other day, and nystatin powder to the affected areas.   OBSTETRICAL HISTORY:  G4, P2 with one termination and one ectopic  pregnancy.  She had two vaginal deliveries.   GYN HISTORY:  Menarche at age 65.  Cycles are regular up until 3 years  ago when they were still fairly heavy, however, she has not had a true  cycle in 3 years.  She has pink discharge which may or may not be  spotting once a month.  She has a history of Chlamydia.  She has a  history of abnormal Pap smear, last Pap was June 2011 and was ASCUS with  negative high-risk HPV.  She has a history of fibroid uterus.   SOCIAL HISTORY:  She is a smoker half pack per  day for over 20 years.  Two alcoholic beverages per week.  She denies other drug use.   FAMILY HISTORY:  Significant for diabetes, heart disease, hypertension,  and blood clots.   REVIEW OF SYSTEMS:  Fourteen-point review of system is reviewed is  diffusely positive.  Please see GYN history in the chart.  She complains  of weight gain, fatigue, menopausal symptoms, hot flashes, depression,  anxiety, headache, lightheadedness, shortness of breath, palpitations,  muscle or joint pain, easy bruising, nausea, vomiting, abdominal pain,  frequent urination and vaginal discharge most of which will be addressed  by her primary care Aftyn Nott.   PHYSICAL EXAMINATION:  VITAL SIGNS:  On exam today, her weight is 227  pounds.  Blood pressure is 138/102, pulse is 102.  GENERAL:  She is a well-developed, well-nourished female in no acute  distress.  ABDOMEN:  Soft, nontender, nondistended.  GU:  Normal external female genitalia.  BUS normal.  Vagina is pale with  loss of rugation.  Cervix is parous without lesions.  Uterus is  small,  anteverted, mobile, firm.  No adnexal masses or tenderness appreciated.  Her exam is somewhat limited by body habitus.   PROCEDURE:  Cervix was cleaned with Betadine x2 and OS finder was used.  The sound did pass eventually, but the patient was unable tolerate the  insertion of the Pipelle, and the cervix was somewhat stenotic.   IMPRESSION:  Nausea of unclear etiology, fibroid uterus which is not a  current issue for the patient, questionable postmenopausal bleeding with  an endometrial stripe of only 3 mm, not significant ASCUS, Pap was  negative, high-risk HPV also nonsignificant.   PLAN:  The patient really does want sampling to take place and for  completeness sake I agreed with this plan.  We will give her a trial of  Cytotec and have her come back in after taking Tylenol.  She can  tolerate the procedure hopefully with Cytotec will be able to slip a  Pipelle and get a biopsy.  Additionally, we would recommend followup  ultrasound to follow up her fibroids to make sure they are not growing  and to re-look at her endometrial thickness whether the biopsy is  successful or not.           ______________________________  Tinnie Gens, MD     TP/MEDQ  D:  08/31/2009  T:  09/01/2009  Job:  [id number]

## 2010-07-01 ENCOUNTER — Telehealth: Payer: Self-pay | Admitting: Family Medicine

## 2010-07-01 ENCOUNTER — Encounter: Payer: Self-pay | Admitting: Family Medicine

## 2010-07-01 NOTE — Telephone Encounter (Signed)
Pt notified that letter completed and she can pick up. Jarvis Newcomer, LPN [facility] Dimes

## 2010-07-01 NOTE — Telephone Encounter (Signed)
Pt called and sounded frustrated because it had been a week and she has not heard about a letter the provider was suppose to be writing for her for extra time for school work and to do her test.  Would like a response today about when she can get the letter. Plan:  Routed to Dr. Marlyne Beards, LPN [facility]

## 2010-07-01 NOTE — Telephone Encounter (Signed)
Letter, it ready. It is upfront.

## 2010-07-04 ENCOUNTER — Telehealth: Payer: Self-pay | Admitting: *Deleted

## 2010-07-04 NOTE — Telephone Encounter (Signed)
Pt called and statse she is not happy with letter that was written for her. She does not think her mood disorder should be mentioned in the letter

## 2010-07-05 NOTE — Telephone Encounter (Signed)
K. that is fine. I can rewrite the letter. I still have to have a reason for why she needs extra time. Just let me know what she would like me to send a letter.

## 2010-07-06 ENCOUNTER — Other Ambulatory Visit: Payer: Self-pay | Admitting: *Deleted

## 2010-07-06 DIAGNOSIS — I10 Essential (primary) hypertension: Secondary | ICD-10-CM

## 2010-07-06 MED ORDER — VERAPAMIL HCL ER 360 MG PO CP24: 360.0000 mg | ORAL_CAPSULE | Freq: Every day | ORAL | Status: DC

## 2010-07-06 MED ORDER — ATENOLOL 50 MG PO TABS: 50.0000 mg | ORAL_TABLET | Freq: Every day | ORAL | Status: DC

## 2010-07-06 NOTE — Telephone Encounter (Signed)
Pt called and states she wants the letter to mention her carpal tunnel and her fibromyalgia

## 2010-07-06 NOTE — Telephone Encounter (Signed)
Called yesterday and this am and no answer and no vm

## 2010-07-07 NOTE — Telephone Encounter (Signed)
Letter rEADY FOR HER TO pICK UP.

## 2010-07-08 ENCOUNTER — Other Ambulatory Visit: Payer: Self-pay | Admitting: Family Medicine

## 2010-07-08 NOTE — Telephone Encounter (Signed)
Request for verapamil 360mg , atenolol 50mg . To [facility]. These were sent escribe to [facility] on 07-06-10. Jarvis Newcomer, LPN [facility] Dimes

## 2010-07-21 ENCOUNTER — Telehealth: Payer: Self-pay | Admitting: Family Medicine

## 2010-07-21 NOTE — Telephone Encounter (Signed)
Patient called request a referral for her back. Patient states she needs to see Dr. Dawayne Cirri her insurance states she needs to go this Dr.

## 2010-07-21 NOTE — Telephone Encounter (Signed)
Is this an orthopedist?   I need to know what kind of referral to enter.

## 2010-07-25 NOTE — Telephone Encounter (Signed)
LMOM for Pt to CB w/ more info

## 2010-07-26 ENCOUNTER — Other Ambulatory Visit: Payer: Self-pay | Admitting: Family Medicine

## 2010-07-26 DIAGNOSIS — M25569 Pain in unspecified knee: Secondary | ICD-10-CM

## 2010-07-28 ENCOUNTER — Telehealth: Payer: Self-pay | Admitting: Family Medicine

## 2010-07-28 NOTE — Telephone Encounter (Signed)
Advised pt. That someone will be calling w/referrral.

## 2010-07-28 NOTE — Telephone Encounter (Signed)
Pt calls and states she wants a referral for pain mgt that takes her ins-[employer].

## 2010-07-28 NOTE — Telephone Encounter (Signed)
Call pt: Please let her know I put the order in for pain management.

## 2010-08-05 ENCOUNTER — Other Ambulatory Visit: Payer: Self-pay | Admitting: Family Medicine

## 2010-08-08 ENCOUNTER — Other Ambulatory Visit: Payer: Self-pay | Admitting: Family Medicine

## 2010-08-10 ENCOUNTER — Telehealth: Payer: Self-pay | Admitting: Family Medicine

## 2010-08-10 MED ORDER — WARFARIN SODIUM 4 MG PO TABS: 4.0000 mg | ORAL_TABLET | ORAL | Status: DC

## 2010-08-10 NOTE — Telephone Encounter (Signed)
Pt called and said the pharm said that the script for diazepam is not at the pharm([facility]). Plan:  Reviewed the pt chart, and told the pt that her script was sent on 08-08-10 for her diazepam.  [facility] says [facility] stated they did not have. Plan:  [facility]ified [facility] pharm,a nd pharmacist said they do have the script and it is ready for pt to pup.  Pharmacist will call the pt and let her know. Jarvis Newcomer, LPN [facility] Dimes

## 2010-08-29 DIAGNOSIS — IMO0001 Reserved for inherently not codable concepts without codable children: Secondary | ICD-10-CM

## 2010-08-29 HISTORY — DX: Reserved for inherently not codable concepts without codable children: IMO0001

## 2010-08-30 ENCOUNTER — Telehealth: Payer: Self-pay | Admitting: Family Medicine

## 2010-08-30 NOTE — Telephone Encounter (Signed)
Trula Ore called from the [facility] and said pt has an appt set up tomorrow and they had sent forms over to be completed by the provider and they need to be returned so pt can proceed with her appt tomorrow.  Please advise. Plan:  Routed to Dr. Marlyne Beards, LPN [facility] Dimes

## 2010-08-30 NOTE — Telephone Encounter (Signed)
Notified the scooter store and spoke with Trula Ore who originally called today to request the completed forms, and she was informed that pt will not qualify for the scooter, and she should notify the pt to let her know she is more than welcome to keep her appt there tomorrow but provider feels she will not qualify at this time. Jarvis Newcomer, LPN [facility]imes

## 2010-08-30 NOTE — Telephone Encounter (Signed)
I can tell you now she will not qualify for scooter but she wants to keep the appointment tomorrow she can. Certainly we can do the evaluation but she will qualify. I haven't seen the forms, but I will check my box again later today.

## 2010-08-31 ENCOUNTER — Ambulatory Visit: Payer: Self-pay | Admitting: Family Medicine

## 2010-08-31 ENCOUNTER — Encounter: Payer: Self-pay | Admitting: Family Medicine

## 2010-08-31 ENCOUNTER — Ambulatory Visit (INDEPENDENT_AMBULATORY_CARE_PROVIDER_SITE_OTHER): Payer: 59 | Admitting: Family Medicine

## 2010-08-31 VITALS — BP 167/97 | HR 94 | Wt 230.0 lb

## 2010-08-31 DIAGNOSIS — I2699 Other pulmonary embolism without acute cor pulmonale: Secondary | ICD-10-CM

## 2010-08-31 DIAGNOSIS — I10 Essential (primary) hypertension: Secondary | ICD-10-CM

## 2010-08-31 DIAGNOSIS — G609 Hereditary and idiopathic neuropathy, unspecified: Secondary | ICD-10-CM

## 2010-08-31 MED ORDER — ATENOLOL 50 MG PO TABS: 50.0000 mg | ORAL_TABLET | Freq: Every day | ORAL | Status: DC

## 2010-08-31 NOTE — Progress Notes (Signed)
Subjective:    Patient ID: Andrea Cisneros, female    DOB: 1950/07/02, 60 y.o.   MRN: [id number]  HPI  Most of the time uses a cane for mobility and using rolling walker with a seat. Walks about 25 feet before has to sit down and rest.  Can walk 5 feet without a mobility device. Has a hard time being able to stand up to cook, washing her clothes.  Can stand for about 5 minutes before having to sit down. She often uses a stool to sit her counter and prepare her foods. Never had a wheelchair.  She is able to bath herself if she is sitting.  Gets dressed by herself.  Able to toilet.  She has a dx of peripheral neuropathy for at least 8 years.  Her knee and back OA was dx about 8 years ago as well. Does have normal trunal stability.  If walks up stairs, has to lead with her right for each step or will fall.  She reports that Does fall frequently.  She does not drive and get someone to take her to her appointments and to the laundromat.   Review of Systems  BP 167/97  Pulse 94  Wt 230 lb (104.327 kg)  SpO2 96%    Allergies  Allergen Reactions  . Lisinopril     REACTION: swelling and angioedema  . Lyrica (Pregabalin)     Upset stomach    No past medical history on file.  Past Surgical History  Procedure Date  . Partial colectomy for diverticulitis   . Tubal ligation   . Right arm   . Gastric bypass     > 21    History   Social History  . Marital Status: Divorced    Spouse Name: N/A    Number of Children: N/A  . Years of Education: N/A   Occupational History  . Not on file.   Social History Main Topics  . Smoking status: Current Everyday Smoker    Types: Cigarettes  . Smokeless tobacco: Not on file  . Alcohol Use: Yes  . Drug Use: No  . Sexually Active:    Other Topics Concern  . Not on file   Social History Narrative  . No narrative on file    Family History  Problem Relation Age of Onset  . Heart attack Mother   . Diabetes Brother     Ms. Larrick had no  medications administered during this visit.     Objective:   Physical Exam  Constitutional: She appears well-developed and well-nourished.  Cardiovascular: Normal rate, regular rhythm and normal heart sounds.   Pulmonary/Chest: Effort normal and breath sounds normal.  Musculoskeletal:       Neck with normal range of motion. Left shoulder with normal range of motion. Right shoulder with decreased extension to about 100. Strength is 5 out of 5 in the left upper extremity. Strength is 4/5 in the right upper extremity. Hip, knee, ankle range of motion is normal. Left hip strength is 5 out 5, left knee and ankle strength is 5 out of 5. Right hip, knee, ankle strength is 4/5.   Skin: Skin is warm and dry.  Psychiatric: She has a normal mood and affect. Her behavior is normal. Judgment and thought content normal.          Assessment & Plan:  Mobility exam-she does have a history of lower extremity pain. She has a history of peripheral neuropathy as well as osteoarthritis of  the knees and hips. These conditions have been present for at least the last 8 years. She is currently using a cane and walker. This will benefit her quality of life to have a scooter.This will enable her to complete her ADLs such as eating, preparing meals, bathing, and grooming, and toileting.

## 2010-09-01 ENCOUNTER — Telehealth: Payer: Self-pay | Admitting: Family Medicine

## 2010-09-01 NOTE — Telephone Encounter (Signed)
Advised pt of change in coumadin and to have it repeated x in 1 wk.

## 2010-09-05 ENCOUNTER — Encounter: Payer: Self-pay | Admitting: *Deleted

## 2010-09-05 ENCOUNTER — Encounter: Payer: Self-pay | Admitting: Family Medicine

## 2010-09-06 ENCOUNTER — Ambulatory Visit: Payer: 59 | Admitting: Obstetrics & Gynecology

## 2010-09-09 ENCOUNTER — Ambulatory Visit (INDEPENDENT_AMBULATORY_CARE_PROVIDER_SITE_OTHER): Payer: 59 | Admitting: Family Medicine

## 2010-09-09 VITALS — BP 130/87 | HR 80 | Wt 225.0 lb

## 2010-09-09 DIAGNOSIS — I2699 Other pulmonary embolism without acute cor pulmonale: Secondary | ICD-10-CM

## 2010-09-14 ENCOUNTER — Other Ambulatory Visit: Payer: Self-pay | Admitting: Family Medicine

## 2010-09-19 ENCOUNTER — Other Ambulatory Visit: Payer: Self-pay | Admitting: Family Medicine

## 2010-09-23 ENCOUNTER — Ambulatory Visit: Payer: 59

## 2010-09-27 ENCOUNTER — Ambulatory Visit (INDEPENDENT_AMBULATORY_CARE_PROVIDER_SITE_OTHER): Payer: 59 | Admitting: Family Medicine

## 2010-09-27 VITALS — BP 132/91 | HR 69

## 2010-09-27 DIAGNOSIS — I2699 Other pulmonary embolism without acute cor pulmonale: Secondary | ICD-10-CM

## 2010-09-27 LAB — POCT INR: INR: 2.6

## 2010-09-27 NOTE — Progress Notes (Signed)
  Subjective:    Patient ID: Andrea Cisneros, female    DOB: 01/03/1951, 60 y.o.   MRN: [id number]  HPI INR needed for coumadin therapy    Review of Systems     Objective:   Physical Exam        Assessment & Plan:

## 2010-10-14 ENCOUNTER — Other Ambulatory Visit: Payer: Self-pay | Admitting: Family Medicine

## 2010-10-26 ENCOUNTER — Other Ambulatory Visit: Payer: Self-pay | Admitting: Family Medicine

## 2010-10-31 ENCOUNTER — Ambulatory Visit (INDEPENDENT_AMBULATORY_CARE_PROVIDER_SITE_OTHER): Payer: 59 | Admitting: Family Medicine

## 2010-10-31 VITALS — BP 128/89 | HR 76 | Wt 225.0 lb

## 2010-10-31 DIAGNOSIS — I2699 Other pulmonary embolism without acute cor pulmonale: Secondary | ICD-10-CM

## 2010-10-31 LAB — POCT INR: INR: 1.7

## 2010-10-31 NOTE — Progress Notes (Signed)
  Subjective:    Patient ID: Andrea Cisneros, female    DOB: October 11, 1950, 60 y.o.   MRN: [id number]  HPI    Review of Systems     Objective:   Physical Exam        Assessment & Plan:  Pt came in this afternoon for INR check.  Level = 1.7.  Pt has not had her med today, and had vomiting yesterday after she took her warfarin.

## 2010-11-01 ENCOUNTER — Telehealth: Payer: Self-pay | Admitting: Family Medicine

## 2010-11-01 ENCOUNTER — Other Ambulatory Visit: Payer: Self-pay | Admitting: Family Medicine

## 2010-11-01 DIAGNOSIS — M79673 Pain in unspecified foot: Secondary | ICD-10-CM

## 2010-11-01 NOTE — Telephone Encounter (Signed)
I put in her referral for podiatry but the fom was for an MRI.  I don't even know what she needs an MRI for.

## 2010-11-01 NOTE — Telephone Encounter (Signed)
Pt calling just to remind Dr. Linford Arnold to complete the form she dropped off yesterday when she was in to get her coumadin (INR) checked. Plan:  Reinforced with the pt that the form was given directly to Dr. Linford Arnold and she will get to it just as quickly as she can. Routed to Dr. Linford Arnold as a reminder only. Jarvis Newcomer, LPN [facility] Dimes

## 2010-11-01 NOTE — Progress Notes (Signed)
  Subjective:    Patient ID: Andrea Cisneros, female    DOB: 1950/04/20, 61 y.o.   MRN: [id number]  HPI Patient requested referral to the foot centers in [place] for foot pain. The referral was entered.   Review of Systems     Objective:   Physical Exam        Assessment & Plan:

## 2010-11-02 NOTE — Telephone Encounter (Signed)
LMOM for the pt to return call to the triage nurse.  Need to know why pt needs MRI(that is what the form is for that the pt dropped off). Pending pt call back. Jarvis Newcomer, LPN [facility] Dimes

## 2010-11-04 ENCOUNTER — Other Ambulatory Visit: Payer: Self-pay | Admitting: Family Medicine

## 2010-11-08 ENCOUNTER — Ambulatory Visit: Payer: 59

## 2010-11-09 NOTE — Telephone Encounter (Signed)
This encounter was taken care of by the provider. Jarvis Newcomer, LPN [facility] Dimes

## 2010-11-21 ENCOUNTER — Other Ambulatory Visit: Payer: Self-pay | Admitting: Family Medicine

## 2011-01-02 ENCOUNTER — Other Ambulatory Visit: Payer: Self-pay | Admitting: *Deleted

## 2011-01-02 MED ORDER — DIAZEPAM 2 MG PO TABS: 2.0000 mg | ORAL_TABLET | Freq: Three times a day (TID) | ORAL | Status: DC | PRN

## 2011-01-02 MED ORDER — PREGABALIN 75 MG PO CAPS: 75.0000 mg | ORAL_CAPSULE | Freq: Two times a day (BID) | ORAL | Status: DC

## 2011-01-02 MED ORDER — WARFARIN SODIUM 6 MG PO TABS: 6.0000 mg | ORAL_TABLET | Freq: Every day | ORAL | Status: DC

## 2011-01-02 MED ORDER — VERAPAMIL HCL ER 360 MG PO CP24: 360.0000 mg | ORAL_CAPSULE | Freq: Every day | ORAL | Status: DC

## 2011-01-13 ENCOUNTER — Telehealth: Payer: Self-pay | Admitting: *Deleted

## 2011-01-13 NOTE — Telephone Encounter (Signed)
Ok to come off coumadin for surgery. She is on it for PE and isn't very complaint with her medication anyway. She can stop 5 days prior and then restart 24-48 hours post op.

## 2011-01-13 NOTE — Telephone Encounter (Signed)
Andrea Cisneros with Dr. Corinne Ports- neurosurgeon- would like to know if pt can come off the Coumadin in order to do  Lumbar surgery. Please advise? If neeeds to be on Lovenox they want you to handle

## 2011-02-10 ENCOUNTER — Other Ambulatory Visit: Payer: Self-pay | Admitting: Family Medicine

## 2011-03-10 ENCOUNTER — Ambulatory Visit: Payer: 59 | Admitting: Family Medicine

## 2011-03-27 ENCOUNTER — Other Ambulatory Visit: Payer: Self-pay | Admitting: Family Medicine

## 2011-04-16 ENCOUNTER — Other Ambulatory Visit: Payer: Self-pay | Admitting: Family Medicine

## 2011-04-22 ENCOUNTER — Other Ambulatory Visit: Payer: Self-pay | Admitting: Family Medicine

## 2011-05-04 ENCOUNTER — Ambulatory Visit: Payer: 59 | Admitting: Family Medicine

## 2011-05-05 ENCOUNTER — Ambulatory Visit: Payer: Self-pay | Admitting: Family Medicine

## 2011-05-05 ENCOUNTER — Ambulatory Visit (INDEPENDENT_AMBULATORY_CARE_PROVIDER_SITE_OTHER): Payer: 59 | Admitting: Family Medicine

## 2011-05-05 ENCOUNTER — Encounter: Payer: Self-pay | Admitting: Family Medicine

## 2011-05-05 ENCOUNTER — Telehealth: Payer: Self-pay | Admitting: *Deleted

## 2011-05-05 VITALS — BP 134/83 | HR 75 | Ht 67.0 in | Wt 235.0 lb

## 2011-05-05 DIAGNOSIS — Z9884 Bariatric surgery status: Secondary | ICD-10-CM

## 2011-05-05 DIAGNOSIS — L639 Alopecia areata, unspecified: Secondary | ICD-10-CM | POA: Insufficient documentation

## 2011-05-05 DIAGNOSIS — R5383 Other fatigue: Secondary | ICD-10-CM

## 2011-05-05 DIAGNOSIS — I2699 Other pulmonary embolism without acute cor pulmonale: Secondary | ICD-10-CM

## 2011-05-05 LAB — POCT INR: INR: 1.8

## 2011-05-05 MED ORDER — CYANOCOBALAMIN 1000 MCG/ML IJ SOLN
1000.0000 ug | Freq: Once | INTRAMUSCULAR | Status: AC
  Administered 2011-05-05: 1000 ug via INTRAMUSCULAR

## 2011-05-05 NOTE — Progress Notes (Signed)
  Subjective:    Patient ID: Andrea Cisneros, female    DOB: 10/08/1950, 61 y.o.   MRN: [id number]  HPI Hasn't felt well for 2 week. Hx of occ ringing but have been constantly ringing. She can't remember which ENT  She saw. Says it was over the summer.   [place]ions - plans on seeing podiatry for this. Having a lot of pain but does wear a lot of dress shoes.   Gastric surgery. Used to get Vit B12 shots every 30 days. Takes Vit D every day.  Says just feel tired, run down, not sleeping well. Can't afford her lyrica anymore.    Needs for letter for tier exemptoin for betamethasone.  Cost went up to $40 and she uses on her head for her spots of alopecia and foe her eczema.    Review of Systems     Objective:   Physical Exam  Constitutional: She is oriented to person, place, and time. She appears well-developed and well-nourished.  HENT:  Head: Normocephalic and atraumatic.  Eyes: Conjunctivae are normal. Pupils are equal, round, and reactive to light.  Cardiovascular: Normal rate, regular rhythm and normal heart sounds.   Pulmonary/Chest: Effort normal and breath sounds normal.  Musculoskeletal: She exhibits no edema.  Neurological: She is alert and oriented to person, place, and time. She has normal reflexes. No cranial nerve deficit.  Skin: Skin is warm and dry.  Psychiatric: She has a normal mood and affect. Her behavior is normal.          Assessment & Plan:  Gastric surgery.  - Check B12, iron, folate, magnesium.  Will give B12 injection after having labs drawn today.   Due for INR - See coumadin flowsheet. She was supposed to f/u in 1 weeks instead of 3 months. Reminded her the importance of keeping her followups.    Eczema and alopecia areta - Will write letter to see if can get a [place] exemption.  Wil we call and see what is preferreed on her plan.   She needs mammo but want to hold off for now.

## 2011-05-06 LAB — CBC
MCH: 32 pg (ref 26.0–34.0)
MCV: 96.1 fL (ref 78.0–100.0)
Platelets: 199 10*3/uL (ref 150–400)
RDW: 13.9 % (ref 11.5–15.5)
WBC: 3.8 10*3/uL — ABNORMAL LOW (ref 4.0–10.5)

## 2011-05-06 LAB — COMPLETE METABOLIC PANEL WITH GFR
ALT: 10 U/L (ref 0–35)
AST: 20 U/L (ref 0–37)
Albumin: 4.2 g/dL (ref 3.5–5.2)
CO2: 21 mEq/L (ref 19–32)
Calcium: 8.9 mg/dL (ref 8.4–10.5)
Chloride: 110 mEq/L (ref 96–112)
Creat: 0.81 mg/dL (ref 0.50–1.10)
GFR, Est African American: >89 mL/min
Potassium: 4 mEq/L (ref 3.5–5.3)
Sodium: 141 mEq/L (ref 135–145)
Total Protein: 7.3 g/dL (ref 6.0–8.3)

## 2011-05-06 LAB — VITAMIN B12: Vitamin B-12: 275 pg/mL (ref 211–911)

## 2011-05-11 ENCOUNTER — Other Ambulatory Visit: Payer: Self-pay | Admitting: Family Medicine

## 2011-05-11 ENCOUNTER — Ambulatory Visit: Payer: 59 | Admitting: Family Medicine

## 2011-05-11 MED ORDER — CLOBETASOL PROPIONATE 0.05 % EX OINT: TOPICAL_OINTMENT | Freq: Every day | CUTANEOUS | Status: DC | PRN

## 2011-05-11 NOTE — Progress Notes (Signed)
Betamethasone steroid cream changed to clobetasol for cost reasons.

## 2011-05-16 ENCOUNTER — Ambulatory Visit (INDEPENDENT_AMBULATORY_CARE_PROVIDER_SITE_OTHER): Payer: 59 | Admitting: Family Medicine

## 2011-05-16 ENCOUNTER — Telehealth: Payer: Self-pay | Admitting: *Deleted

## 2011-05-16 ENCOUNTER — Ambulatory Visit: Payer: 59 | Admitting: Family Medicine

## 2011-05-16 VITALS — BP 135/85 | HR 82

## 2011-05-16 DIAGNOSIS — E538 Deficiency of other specified B group vitamins: Secondary | ICD-10-CM

## 2011-05-16 DIAGNOSIS — I2699 Other pulmonary embolism without acute cor pulmonale: Secondary | ICD-10-CM

## 2011-05-16 LAB — POCT INR: INR: 3.7

## 2011-05-16 MED ORDER — CYANOCOBALAMIN 1000 MCG/ML IJ SOLN
1000.0000 ug | Freq: Once | INTRAMUSCULAR | Status: AC
  Administered 2011-05-16: 1000 ug via INTRAMUSCULAR

## 2011-05-16 NOTE — Telephone Encounter (Signed)
Pt notified of results and Coumadin instructions. Pt is to take Coumadin 6mg  everyday except for Wednesday and Saturday take  4mg  dose. Recheck in 1 week. Pt verbalized understanding. KJ LPN

## 2011-05-16 NOTE — Telephone Encounter (Signed)
She can schedule a physical with Pap smear anytime.

## 2011-05-16 NOTE — Progress Notes (Signed)
Patient ID: Andrea Cisneros, female   DOB: [birth year], 61 y.o.   MRN: [id number] INR for coumadin therapy. Pt states she is currently taking 6mg  everyday

## 2011-05-16 NOTE — Telephone Encounter (Signed)
Patient called and request to know when she should get pap smear. Pt states you advised her she needs a pap but she would like to know if there is a certain date she needs to schedule by. Pt would like a call back to be notified.. Thanks.

## 2011-05-23 ENCOUNTER — Ambulatory Visit: Payer: 59

## 2011-05-26 ENCOUNTER — Other Ambulatory Visit: Payer: Self-pay | Admitting: Family Medicine

## 2011-06-01 ENCOUNTER — Ambulatory Visit (INDEPENDENT_AMBULATORY_CARE_PROVIDER_SITE_OTHER): Payer: 59 | Admitting: Family Medicine

## 2011-06-01 ENCOUNTER — Encounter: Payer: Self-pay | Admitting: Family Medicine

## 2011-06-01 ENCOUNTER — Other Ambulatory Visit (HOSPITAL_COMMUNITY)
Admission: RE | Admit: 2011-06-01 | Discharge: 2011-06-01 | Disposition: A | Discharge status: Home / Self Care | Payer: Medicare PPO | Source: Ambulatory Visit | Attending: Family Medicine | Admitting: Family Medicine

## 2011-06-01 ENCOUNTER — Ambulatory Visit: Payer: Self-pay | Admitting: Family Medicine

## 2011-06-01 VITALS — BP 144/92 | HR 91 | Ht 67.0 in | Wt 238.0 lb

## 2011-06-01 DIAGNOSIS — Z13220 Encounter for screening for lipoid disorders: Secondary | ICD-10-CM

## 2011-06-01 DIAGNOSIS — I2699 Other pulmonary embolism without acute cor pulmonale: Secondary | ICD-10-CM

## 2011-06-01 DIAGNOSIS — D510 Vitamin B12 deficiency anemia due to intrinsic factor deficiency: Secondary | ICD-10-CM

## 2011-06-01 DIAGNOSIS — Z01419 Encounter for gynecological examination (general) (routine) without abnormal findings: Secondary | ICD-10-CM

## 2011-06-01 DIAGNOSIS — Z0000 Encounter for general adult medical examination without abnormal findings: Secondary | ICD-10-CM

## 2011-06-01 DIAGNOSIS — D219 Benign neoplasm of connective and other soft tissue, unspecified: Secondary | ICD-10-CM

## 2011-06-01 DIAGNOSIS — Z1231 Encounter for screening mammogram for malignant neoplasm of breast: Secondary | ICD-10-CM

## 2011-06-01 DIAGNOSIS — M549 Dorsalgia, unspecified: Secondary | ICD-10-CM

## 2011-06-01 LAB — POCT INR: INR: 2.8

## 2011-06-01 MED ORDER — PREGABALIN 75 MG PO CAPS: 75.0000 mg | ORAL_CAPSULE | Freq: Two times a day (BID) | ORAL | Status: DC
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
## 2011-06-01 NOTE — Patient Instructions (Signed)
Start a regular exercise program and make sure you are eating a healthy diet Try to eat 4 servings of dairy a day or take a calcium supplement (500mg twice a day). Your vaccines are up to date.   

## 2011-06-02 LAB — CBC
MCH: 31.4 pg (ref 26.0–34.0)
MCHC: 33 g/dL (ref 30.0–36.0)
Platelets: 185 10*3/uL (ref 150–400)
RBC: 4.72 MIL/uL (ref 3.87–5.11)

## 2011-06-02 LAB — COMPLETE METABOLIC PANEL WITH GFR
ALT: 11 U/L (ref 0–35)
Alkaline Phosphatase: 99 U/L (ref 39–117)
GFR, Est Non African American: 75 mL/min
Sodium: 140 mEq/L (ref 135–145)
Total Bilirubin: 0.5 mg/dL (ref 0.3–1.2)
Total Protein: 7.5 g/dL (ref 6.0–8.3)

## 2011-06-02 LAB — LIPID PANEL
Cholesterol: 193 mg/dL (ref 0–200)
Triglycerides: 88 mg/dL (ref <150)

## 2011-06-06 ENCOUNTER — Ambulatory Visit: Payer: 59 | Admitting: Family Medicine

## 2011-06-13 ENCOUNTER — Ambulatory Visit: Payer: 59 | Admitting: Obstetrics & Gynecology

## 2011-06-13 DIAGNOSIS — D259 Leiomyoma of uterus, unspecified: Secondary | ICD-10-CM

## 2011-06-15 ENCOUNTER — Ambulatory Visit: Payer: 59 | Admitting: Family Medicine

## 2011-06-15 DIAGNOSIS — Z0289 Encounter for other administrative examinations: Secondary | ICD-10-CM

## 2011-06-22 ENCOUNTER — Telehealth: Payer: Self-pay | Admitting: *Deleted

## 2011-06-22 NOTE — Telephone Encounter (Signed)
Pt called and states pharm says there is a hold on her tramadol. Pt is on lyrica which was just written 4/18. Can she be on both?

## 2011-06-23 ENCOUNTER — Other Ambulatory Visit: Payer: Self-pay | Admitting: *Deleted

## 2011-06-23 MED ORDER — TRAMADOL HCL 50 MG PO TABS: 50.0000 mg | ORAL_TABLET | Freq: Two times a day (BID) | ORAL | Status: DC

## 2011-06-23 NOTE — Telephone Encounter (Signed)
Ok to fill med tramadol

## 2011-06-29 ENCOUNTER — Other Ambulatory Visit: Payer: Self-pay | Admitting: Family Medicine

## 2011-07-07 ENCOUNTER — Other Ambulatory Visit: Payer: Self-pay | Admitting: Family Medicine

## 2011-07-27 ENCOUNTER — Other Ambulatory Visit: Payer: Self-pay | Admitting: Family Medicine

## 2011-08-11 ENCOUNTER — Other Ambulatory Visit: Payer: Self-pay | Admitting: Family Medicine

## 2011-08-15 ENCOUNTER — Other Ambulatory Visit: Payer: Self-pay | Admitting: Family Medicine

## 2011-09-11 ENCOUNTER — Ambulatory Visit: Payer: 59 | Admitting: Family Medicine

## 2011-09-14 ENCOUNTER — Ambulatory Visit (INDEPENDENT_AMBULATORY_CARE_PROVIDER_SITE_OTHER): Payer: 59 | Admitting: Family Medicine

## 2011-09-14 ENCOUNTER — Encounter: Payer: Self-pay | Admitting: Family Medicine

## 2011-09-14 ENCOUNTER — Ambulatory Visit: Payer: Self-pay | Admitting: Family Medicine

## 2011-09-14 VITALS — BP 129/88 | HR 86 | Ht 67.0 in | Wt 232.0 lb

## 2011-09-14 DIAGNOSIS — F17200 Nicotine dependence, unspecified, uncomplicated: Secondary | ICD-10-CM

## 2011-09-14 DIAGNOSIS — E1051 Type 1 diabetes mellitus with diabetic peripheral angiopathy without gangrene: Secondary | ICD-10-CM | POA: Insufficient documentation

## 2011-09-14 DIAGNOSIS — I10 Essential (primary) hypertension: Secondary | ICD-10-CM

## 2011-09-14 DIAGNOSIS — R7301 Impaired fasting glucose: Secondary | ICD-10-CM

## 2011-09-14 DIAGNOSIS — R635 Abnormal weight gain: Secondary | ICD-10-CM

## 2011-09-14 DIAGNOSIS — I2699 Other pulmonary embolism without acute cor pulmonale: Secondary | ICD-10-CM

## 2011-09-14 DIAGNOSIS — E1059 Type 1 diabetes mellitus with other circulatory complications: Secondary | ICD-10-CM | POA: Insufficient documentation

## 2011-09-14 DIAGNOSIS — Z720 Tobacco use: Secondary | ICD-10-CM

## 2011-09-14 LAB — POCT INR: INR: 3.1

## 2011-09-14 LAB — POCT GLYCOSYLATED HEMOGLOBIN (HGB A1C): Hemoglobin A1C: 6.2

## 2011-09-14 MED ORDER — DIAZEPAM 2 MG PO TABS: 2.0000 mg | ORAL_TABLET | Freq: Three times a day (TID) | ORAL | Status: DC | PRN

## 2011-09-14 MED ORDER — WARFARIN SODIUM 6 MG PO TABS: 6.0000 mg | ORAL_TABLET | Freq: Every day | ORAL | Status: DC

## 2011-09-14 MED ORDER — CITALOPRAM HYDROBROMIDE 20 MG PO TABS: 20.0000 mg | ORAL_TABLET | Freq: Every day | ORAL | Status: DC

## 2011-09-14 MED ORDER — CLOBETASOL PROPIONATE 0.05 % EX OINT: TOPICAL_OINTMENT | Freq: Every day | CUTANEOUS | Status: DC | PRN

## 2011-09-14 MED ORDER — PHENTERMINE HCL 37.5 MG PO CAPS: 37.5000 mg | ORAL_CAPSULE | ORAL | Status: DC

## 2011-09-14 MED ORDER — ATENOLOL 50 MG PO TABS: 50.0000 mg | ORAL_TABLET | Freq: Every day | ORAL | Status: DC

## 2011-09-14 MED ORDER — WARFARIN SODIUM 4 MG PO TABS: 4.0000 mg | ORAL_TABLET | ORAL | Status: DC

## 2011-09-14 MED ORDER — VERAPAMIL HCL ER 360 MG PO CP24: 360.0000 mg | ORAL_CAPSULE | Freq: Every day | ORAL | Status: DC

## 2011-09-14 MED ORDER — PREGABALIN 75 MG PO CAPS: 75.0000 mg | ORAL_CAPSULE | Freq: Two times a day (BID) | ORAL | Status: DC

## 2011-09-14 NOTE — Progress Notes (Signed)
  Subjective:    Patient ID: Andrea Cisneros, female    DOB: [birth year], 61 y.o.   MRN: [id number]  HPI HTN - Out of her meds, atenolol, and verapamil.  No CP or SOB. Says she has no hx of heart problems.   Saw her surgeon and he recommended back surgery.  She does walk daily with her walker. He wants her to lose 45 lbs before he will do surgery.  Dr Remigio Eisenmenger told her needs to lose 35lb for her knee replacement.  She has lost 7 lbs since he told her to loose weight.    Hasn't had an INR checked since April, 2013.  History of PE.  She's also here to followup on her abnormal blood work. Her glucose was elevated on her last set of labs from April. Glucose was 129. She has a history of being prediabetic before her gastric bypass surgery.  Review of Systems BP 129/88  Pulse 86  Ht 5\' 7"  (1.702 m)  Wt 232 lb (105.235 kg)  BMI 36.34 kg/m2    Allergies  Allergen Reactions  . Lisinopril     REACTION: swelling and angioedema  . Lyrica (Pregabalin)     Upset stomach    No past medical history on file.  Past Surgical History  Procedure Date  . Partial colectomy for diverticulitis   . Tubal ligation   . Right arm   . Gastric bypass     > 20    History   Social History  . Marital Status: Divorced    Spouse Name: N/A    Number of Children: N/A  . Years of Education: N/A   Occupational History  . Not on file.   Social History Main Topics  . Smoking status: Current Everyday Smoker    Types: Cigarettes  . Smokeless tobacco: Not on file  . Alcohol Use: Yes  . Drug Use: No  . Sexually Active:    Other Topics Concern  . Not on file   Social History Narrative  . No narrative on file    Family History  Problem Relation Age of Onset  . Heart attack Mother   . Diabetes Brother         Objective:   Physical Exam  Constitutional: She is oriented to person, place, and time. She appears well-developed and well-nourished.  HENT:  Head: Normocephalic and atraumatic.    Cardiovascular: Normal rate, regular rhythm and normal heart sounds.   Pulmonary/Chest: Effort normal and breath sounds normal.  Neurological: She is alert and oriented to person, place, and time.  Skin: Skin is warm and dry.  Psychiatric: She has a normal mood and affect. Her behavior is normal.          Assessment & Plan:  HTN- well controlled. REfills sent to pharmacy.   PE - See anticoagulation flowsheet.  Obesity - start the phentermine. Continue your walking program and work on her diet.  Hopefully this will help with her goal of getting her back and knee surgery. No prior hx of heart disease.   Tob abuse - Neesd to quit. H.O given.   IFG - A1C today was 6.2.  New Dx.  Discussed work on exercise, weight loss, and low sugar and low carbs.  REcheck in 6 months.  Can consider metformin if the phentermine is not helping or if the A1C is going up.

## 2011-09-14 NOTE — Patient Instructions (Addendum)
Recheck INR in one week. Smoking Cessation, Tips for Success YOU CAN QUIT SMOKING If you are ready to quit smoking, congratulations! You have chosen to help yourself be healthier. Cigarettes bring nicotine, tar, carbon monoxide, and other irritants into your body. Your lungs, heart, and blood vessels will be able to work better without these poisons. There are many different ways to quit smoking. Nicotine gum, nicotine patches, a nicotine inhaler, or nicotine nasal spray can help with physical craving. Hypnosis, support groups, and medicines help break the habit of smoking. Here are some tips to help you quit for good.  Throw away all cigarettes.   Clean and remove all ashtrays from your home, work, and car.   On a card, write down your reasons for quitting. Carry the card with you and read it when you get the urge to smoke.   Cleanse your body of nicotine. Drink enough water and fluids to keep your urine clear or pale yellow. Do this after quitting to flush the nicotine from your body.   Learn to predict your moods. Do not let a bad situation be your excuse to have a cigarette. Some situations in your life might tempt you into wanting a cigarette.   Never have "just one" cigarette. It leads to wanting another and another. Remind yourself of your decision to quit.   Change habits associated with smoking. If you smoked while driving or when feeling stressed, try other activities to replace smoking. Stand up when drinking your coffee. Brush your teeth after eating. Sit in a different chair when you read the paper. Avoid alcohol while trying to quit, and try to drink fewer caffeinated beverages. Alcohol and caffeine may urge you to smoke.   Avoid foods and drinks that can trigger a desire to smoke, such as sugary or spicy foods and alcohol.   Ask people who smoke not to smoke around you.   Have something planned to do right after eating or having a cup of coffee. Take a walk or exercise to perk  you up. This will help to keep you from overeating.   Try a relaxation exercise to calm you down and decrease your stress. Remember, you may be tense and nervous for the first 2 weeks after you quit, but this will pass.   Find new activities to keep your hands busy. Play with a pen, coin, or rubber band. Doodle or draw things on paper.   Brush your teeth right after eating. This will help cut down on the craving for the taste of tobacco after meals. You can try mouthwash, too.   Use oral substitutes, such as lemon drops, carrots, a cinnamon stick, or chewing gum, in place of cigarettes. Keep them handy so they are available when you have the urge to smoke.   When you have the urge to smoke, try deep breathing.   Designate your home as a nonsmoking area.   If you are a heavy smoker, ask your caregiver about a prescription for nicotine chewing gum. It can ease your withdrawal from nicotine.   Reward yourself. Set aside the cigarette money you save and buy yourself something nice.   Look for support from others. Join a support group or smoking cessation program. Ask someone at home or at work to help you with your plan to quit smoking.   Always ask yourself, "Do I need this cigarette or is this just a reflex?" Tell yourself, "Today, I choose not to smoke," or "I do not  want to smoke." You are reminding yourself of your decision to quit, even if you do smoke a cigarette.  HOW WILL I FEEL WHEN I QUIT SMOKING?  The benefits of not smoking start within days of quitting.   You may have symptoms of withdrawal because your body is used to nicotine (the addictive substance in cigarettes). You may crave cigarettes, be irritable, feel very hungry, cough often, get headaches, or have difficulty concentrating.   The withdrawal symptoms are only temporary. They are strongest when you first quit but will go away within 10 to 14 days.   When withdrawal symptoms occur, stay in control. Think about your  reasons for quitting. Remind yourself that these are signs that your body is healing and getting used to being without cigarettes.   Remember that withdrawal symptoms are easier to treat than the major diseases that smoking can cause.   Even after the withdrawal is over, expect periodic urges to smoke. However, these cravings are generally short-lived and will go away whether you smoke or not. Do not smoke!   If you relapse and smoke again, do not lose hope. Most smokers quit 3 times before they are successful.   If you relapse, do not give up! Plan ahead and think about what you will do the next time you get the urge to smoke.  LIFE AS A NONSMOKER: MAKE IT FOR A MONTH, MAKE IT FOR LIFE Day 1: Hang this page where you will see it every day. Day 2: Get rid of all ashtrays, matches, and lighters. Day 3: Drink water. Breathe deeply between sips. Day 4: Avoid places with smoke-filled air, such as bars, clubs, or the smoking section of restaurants. Day 5: Keep track of how much money you save by not smoking. Day 6: Avoid boredom. Keep a good book with you or go to the movies. Day 7: Reward yourself! One week without smoking! Day 8: Make a dental appointment to get your teeth cleaned. Day 9: Decide how you will turn down a cigarette before it is offered to you. Day 10: Review your reasons for quitting. Day 11: Distract yourself. Stay active to keep your mind off smoking and to relieve tension. Take a walk, exercise, read a book, do a crossword puzzle, or try a new hobby. Day 12: Exercise. Get off the bus before your stop or use stairs instead of escalators. Day 13: Call on friends for support and encouragement. Day 14: Reward yourself! Two weeks without smoking! Day 15: Practice deep breathing exercises. Day 16: Bet a friend that you can stay a nonsmoker. Day 17: Ask to sit in nonsmoking sections of restaurants. Day 18: Hang up "No Smoking" signs. Day 19: Think of yourself as a nonsmoker. Day  20: Each morning, tell yourself you will not smoke. Day 21: Reward yourself! Three weeks without smoking! Day 22: Think of smoking in negative ways. Remember how it stains your teeth, gives you bad breath, and leaves you short of breath. Day 23: Eat a nutritious breakfast. Day 24:Do not relive your days as a smoker. Day 25: Hold a pencil in your hand when talking on the telephone. Day 26: Tell all your friends you do not smoke. Day 27: Think about how much better food tastes. Day 28: Remember, one cigarette is one too many. Day 29: Take up a hobby that will keep your hands busy. Day 30: Congratulations! One month without smoking! Give yourself a big reward. Your caregiver can direct you to community resources  or hospitals for support, which may include:  Group support.   Education.   Hypnosis.   Subliminal therapy.  Document Released: 10/29/2003 Document Revised: 01/19/2011 Document Reviewed: 11/16/2008 [facility] Patient Information 2012 [place], [place].

## 2011-09-19 ENCOUNTER — Other Ambulatory Visit: Payer: Self-pay | Admitting: Family Medicine

## 2011-09-21 ENCOUNTER — Ambulatory Visit: Payer: 59

## 2011-09-22 ENCOUNTER — Other Ambulatory Visit: Payer: Self-pay | Admitting: *Deleted

## 2011-09-22 ENCOUNTER — Ambulatory Visit (INDEPENDENT_AMBULATORY_CARE_PROVIDER_SITE_OTHER): Payer: 59 | Admitting: Family Medicine

## 2011-09-22 VITALS — BP 132/90 | HR 86

## 2011-09-22 DIAGNOSIS — I2699 Other pulmonary embolism without acute cor pulmonale: Secondary | ICD-10-CM

## 2011-09-22 MED ORDER — TRAMADOL HCL 50 MG PO TABS: 50.0000 mg | ORAL_TABLET | Freq: Two times a day (BID) | ORAL | Status: DC

## 2011-09-22 NOTE — Progress Notes (Signed)
  Subjective:    Patient ID: Andrea Cisneros, female    DOB: [birth year], 61 y.o.   MRN: [id number] Pt denies any excessive bruising or bleeding. 5 mins spent with pt.  HPI    Review of Systems     Objective:   Physical Exam        Assessment & Plan:

## 2011-10-06 ENCOUNTER — Ambulatory Visit: Payer: 59

## 2011-11-16 ENCOUNTER — Telehealth: Payer: Self-pay | Admitting: Family Medicine

## 2011-11-16 DIAGNOSIS — Z1231 Encounter for screening mammogram for malignant neoplasm of breast: Secondary | ICD-10-CM

## 2011-11-16 NOTE — Telephone Encounter (Signed)
Pt aware and scheduled apt

## 2011-11-16 NOTE — Telephone Encounter (Signed)
Please call patient reminded her that she is due for mammogram. I will go ahead and place an order and encouraged her to do so. Her last was in 2009 which was 4 years ago.

## 2011-11-27 ENCOUNTER — Other Ambulatory Visit: Payer: Self-pay | Admitting: Family Medicine

## 2011-11-28 ENCOUNTER — Ambulatory Visit (INDEPENDENT_AMBULATORY_CARE_PROVIDER_SITE_OTHER): Payer: 59

## 2011-11-28 DIAGNOSIS — Z1231 Encounter for screening mammogram for malignant neoplasm of breast: Secondary | ICD-10-CM

## 2011-12-28 ENCOUNTER — Other Ambulatory Visit: Payer: Self-pay | Admitting: Family Medicine

## 2012-01-01 ENCOUNTER — Other Ambulatory Visit: Payer: Self-pay | Admitting: Family Medicine

## 2012-01-15 ENCOUNTER — Ambulatory Visit: Payer: 59 | Admitting: Family Medicine

## 2012-01-15 DIAGNOSIS — Z0289 Encounter for other administrative examinations: Secondary | ICD-10-CM

## 2012-01-27 ENCOUNTER — Other Ambulatory Visit: Payer: Self-pay | Admitting: Family Medicine

## 2012-03-14 ENCOUNTER — Telehealth: Payer: Self-pay | Admitting: *Deleted

## 2012-03-14 NOTE — Telephone Encounter (Signed)
error 

## 2012-03-16 ENCOUNTER — Other Ambulatory Visit: Payer: Self-pay | Admitting: Family Medicine

## 2012-03-20 ENCOUNTER — Ambulatory Visit: Payer: 59 | Admitting: Family Medicine

## 2012-03-22 ENCOUNTER — Encounter: Payer: Self-pay | Admitting: *Deleted

## 2012-04-05 ENCOUNTER — Encounter: Payer: Self-pay | Admitting: Family Medicine

## 2012-04-17 ENCOUNTER — Other Ambulatory Visit: Payer: Self-pay | Admitting: Family Medicine

## 2012-04-25 ENCOUNTER — Encounter: Payer: Self-pay | Admitting: Family Medicine

## 2012-04-25 ENCOUNTER — Telehealth: Payer: Self-pay | Admitting: Family Medicine

## 2012-04-25 ENCOUNTER — Ambulatory Visit (INDEPENDENT_AMBULATORY_CARE_PROVIDER_SITE_OTHER): Payer: 59 | Admitting: Family Medicine

## 2012-04-25 ENCOUNTER — Ambulatory Visit: Payer: Self-pay | Admitting: Family Medicine

## 2012-04-25 VITALS — BP 115/80 | HR 75 | Ht 67.6 in | Wt 232.0 lb

## 2012-04-25 DIAGNOSIS — M542 Cervicalgia: Secondary | ICD-10-CM

## 2012-04-25 DIAGNOSIS — E1059 Type 1 diabetes mellitus with other circulatory complications: Secondary | ICD-10-CM

## 2012-04-25 DIAGNOSIS — I2699 Other pulmonary embolism without acute cor pulmonale: Secondary | ICD-10-CM

## 2012-04-25 DIAGNOSIS — R209 Unspecified disturbances of skin sensation: Secondary | ICD-10-CM

## 2012-04-25 DIAGNOSIS — R200 Anesthesia of skin: Secondary | ICD-10-CM

## 2012-04-25 DIAGNOSIS — R7301 Impaired fasting glucose: Secondary | ICD-10-CM

## 2012-04-25 DIAGNOSIS — I10 Essential (primary) hypertension: Secondary | ICD-10-CM

## 2012-04-25 LAB — POCT INR: INR: 2.6

## 2012-04-25 LAB — POCT GLYCOSYLATED HEMOGLOBIN (HGB A1C): Hemoglobin A1C: 6.6

## 2012-04-25 MED ORDER — ALBUTEROL SULFATE HFA 108 (90 BASE) MCG/ACT IN AERS: INHALATION_SPRAY | RESPIRATORY_TRACT | Status: AC

## 2012-04-25 MED ORDER — VERAPAMIL HCL ER 120 MG PO TBCR: 120.0000 mg | EXTENDED_RELEASE_TABLET | Freq: Every day | ORAL | Status: DC

## 2012-04-25 MED ORDER — VERAPAMIL HCL ER 240 MG PO TBCR: 240.0000 mg | EXTENDED_RELEASE_TABLET | Freq: Every day | ORAL | Status: DC

## 2012-04-25 MED ORDER — METFORMIN HCL 500 MG PO TABS: 500.0000 mg | ORAL_TABLET | Freq: Two times a day (BID) | ORAL | Status: DC

## 2012-04-25 MED ORDER — DIAZEPAM 2 MG PO TABS: 2.0000 mg | ORAL_TABLET | Freq: Three times a day (TID) | ORAL | Status: DC | PRN

## 2012-04-25 MED ORDER — ATENOLOL 50 MG PO TABS: 50.0000 mg | ORAL_TABLET | Freq: Every day | ORAL | Status: DC

## 2012-04-25 NOTE — Patient Instructions (Signed)
We will schedule you for an EMG study of your right hand and arm. I would like you to see my partner Dr. Rodney Langton for your knee.

## 2012-04-25 NOTE — Telephone Encounter (Signed)
Call pt: A1C done in the office shows she is now in the diabetic range. I would like ot haver her f/u in the next 2-3 weeks to discuss this.  I would like to start metformin and refer her for nutrition counseling to start.  Also please ask patient when had her PE.? Was that her first? Do they know what caused it?  I want to try to determine if really still needs to be on coumadin or not. Any hx of DVT too? Or just single PE?

## 2012-04-25 NOTE — Progress Notes (Addendum)
Subjective:    Patient ID: Andrea Cisneros, female    DOB: 04/07/1950, 62 y.o.   MRN: [id number]  HPI Right hand pain and numbness intermittantly for the last 2 months.  Says dropping thinks more easily.  She is right handed. Does have a brace that she wears but says not helping.  Says gtting numb in the 3rd and 4th digiti.  Says grip is weak as well.  Also having some right neck pain.  No worsening or alleviating symptoms.  Says Left leg has been giving out on her. Says feels like it is the back of her knee.  Says feels like the muscle contracts and can bend leg. Started about 30 days ago. Has been falling more frequently. She's had problems with both knees before and was seeing orthopedist up until this fall. She says they went out of business. In fact she was supposed to have a followup appointment in October for repeat Synvisc injection in her it was canceled so she never had a followup appointment.    HTN-  Pt denies chest pain, SOB, dizziness, or heart palpitations.  Taking meds as directed w/o problems.  Denies medication side effects. Verapamil is too expensvie. Brought in book.  Review of Systems     Objective:   Physical Exam  Constitutional: She is oriented to person, place, and time. She appears well-developed and well-nourished.  HENT:  Head: Normocephalic and atraumatic.  Cardiovascular: Normal rate, regular rhythm and normal heart sounds.   Pulmonary/Chest: Effort normal and breath sounds normal.  Musculoskeletal:  Neck with normal flexion, dec extension, normal rotation right and left. Slightly decreased side bending to the left. Nontender over the cervical spine. Just slightly decreased extension with the right shoulder compared to the left. She is able to get well above 90. Call sign is positive Phalen's is negative. Grip strength is normal. Right knee is nontender along the joint lines today. She is tender behind the knee. She also had significant pain in the front of the  knee when I extended her leg. Knee flexion and extension is 5 out of 5.  Neurological: She is alert and oriented to person, place, and time.  Skin: Skin is warm and dry.  Psychiatric: She has a normal mood and affect. Her behavior is normal.          Assessment & Plan:  Right hand numbness - Will refer for EMG testing and get cervical spine x-ray today. Her Tinel's sign is positive which could indicate carpal tunnel but because of her right-sided neck pain this could be coming from her neck.Marland Kitchen   HTN-well controlled. Continue current regimen. We will change her verapamil to a tablet which should be cheaper than the capsule. Continue current regimen. Followup in 6 months.  Left leg pain-I. do think she's having significant pain in the hamstrings. I will give her stretches to do a home. I would then like her to see Dr. Rodney Langton for her knees. It sounds like she never had her last Synvisc injection in the am sure he would be happy to do this for her. Not her on anti-inflammatory because of increased bleeding risk with her Coumadin.  History of pulmonary embolism-she has not had her Coumadin checked in over a year. And is asking to have it done today. She never returned after a year ago so we thought that maybe another office was taking care of her Coumadin levels. Fortunately her numbers well-controlled today. Flow sheet given to patient. Due to  recheck levels in 3 weeks. If she is not going to come in regularly for INR checks and she may be a better candidate for one of the other anticoagulants.  IFG - in the diabetic range.  Will have patient return in next couple fo weeks to discuss results.  Lab Results  Component Value Date   HGBA1C 6.6 04/25/2012

## 2012-04-26 NOTE — Telephone Encounter (Signed)
Left message on machine for patient to return my call. See below

## 2012-04-29 NOTE — Telephone Encounter (Signed)
Patient advised. She is unclear of the details of her PE. I called [facility] for her medical records. This may take a few days due to the fact they may need to pull the chart.

## 2012-04-30 ENCOUNTER — Encounter: Payer: Self-pay | Admitting: Family Medicine

## 2012-04-30 DIAGNOSIS — K449 Diaphragmatic hernia without obstruction or gangrene: Secondary | ICD-10-CM | POA: Insufficient documentation

## 2012-04-30 DIAGNOSIS — Z8719 Personal history of other diseases of the digestive system: Secondary | ICD-10-CM | POA: Insufficient documentation

## 2012-05-16 ENCOUNTER — Ambulatory Visit: Payer: Medicare Other | Admitting: Family Medicine

## 2012-05-24 ENCOUNTER — Other Ambulatory Visit: Payer: Self-pay | Admitting: Family Medicine

## 2012-06-03 ENCOUNTER — Ambulatory Visit: Payer: Self-pay | Admitting: Family Medicine

## 2012-06-03 ENCOUNTER — Ambulatory Visit (INDEPENDENT_AMBULATORY_CARE_PROVIDER_SITE_OTHER): Payer: Medicare Other | Admitting: Family Medicine

## 2012-06-03 ENCOUNTER — Encounter: Payer: Self-pay | Admitting: Family Medicine

## 2012-06-03 VITALS — BP 126/83 | HR 73 | Wt 238.0 lb

## 2012-06-03 DIAGNOSIS — E1159 Type 2 diabetes mellitus with other circulatory complications: Secondary | ICD-10-CM

## 2012-06-03 DIAGNOSIS — M79641 Pain in right hand: Secondary | ICD-10-CM

## 2012-06-03 DIAGNOSIS — I2699 Other pulmonary embolism without acute cor pulmonale: Secondary | ICD-10-CM

## 2012-06-03 DIAGNOSIS — M79601 Pain in right arm: Secondary | ICD-10-CM

## 2012-06-03 DIAGNOSIS — M79609 Pain in unspecified limb: Secondary | ICD-10-CM

## 2012-06-03 DIAGNOSIS — M542 Cervicalgia: Secondary | ICD-10-CM

## 2012-06-03 DIAGNOSIS — R635 Abnormal weight gain: Secondary | ICD-10-CM

## 2012-06-03 MED ORDER — GABAPENTIN 100 MG PO CAPS: ORAL_CAPSULE | ORAL | Status: DC

## 2012-06-03 NOTE — Progress Notes (Signed)
  Subjective:    Patient ID: Andrea Cisneros, female    DOB: Apr 16, 1950, 62 y.o.   MRN: [id number]  HPI DM- Has been working hard to eat health. Has only been eating 1000 caolories a day.  She has gained about 4-6 lbs.  She is very frustrated.  Not exercise.    Knee pain - Wants to see Dr. Karie Schwalbe week for possible last symvis injection.  Says the tramadol doesn't help her neck or back. Says it does help her with her back pain.  He was out of town recently.   Still has right hand pain and numbness. Went down stairs for xrays and told they didn't take her insurance.  Still pain in her neck.    Review of Systems     Objective:   Physical Exam  Constitutional: She is oriented to person, place, and time. She appears well-developed and well-nourished.  HENT:  Head: Normocephalic and atraumatic.  Cardiovascular: Normal rate, regular rhythm and normal heart sounds.   Pulmonary/Chest: Effort normal and breath sounds normal.  Neurological: She is alert and oriented to person, place, and time.  Skin: Skin is warm and dry.  Psychiatric: She has a normal mood and affect. Her behavior is normal.          Assessment & Plan:  DM-WEll controlled. Continue current regimen.F/U in 2 months.  Lab Results  Component Value Date   HGBA1C 6.6 04/25/2012   Neck and Back Pain- WEill d/c tramadol since wasn't that helpful, and will try neurontin.  Wrote out a taper of 20 mg 3 times a day over the next month. Will get xray of neck to start and consider EMG study to eval for carpal tunnel.    Left knee pain - Encouraged her to get in with Dr. Karie Schwalbe this week if possible. She is due for her last Synvisc injection which she never had.  Hx of PE - Due to recheck coumadin. See anticoagulation flowsheet.  Right arm/hand pain - Will refer to [facility] for xrays. Can go anytime. Will schedule EMG as well.   Abnormal weight gain. We'll recheck her thyroid today. Last one was checked was about 5 years ago. She is really  made a good effort to change her diet it sounds like she's doing a fantastic job some not sure why she's gained weight.

## 2012-06-04 ENCOUNTER — Ambulatory Visit (INDEPENDENT_AMBULATORY_CARE_PROVIDER_SITE_OTHER): Payer: Medicare Other | Admitting: Sports Medicine

## 2012-06-04 ENCOUNTER — Telehealth: Payer: Self-pay | Admitting: Family Medicine

## 2012-06-04 ENCOUNTER — Encounter: Payer: Self-pay | Admitting: Sports Medicine

## 2012-06-04 VITALS — BP 157/100 | HR 80 | Wt 238.0 lb

## 2012-06-04 DIAGNOSIS — M1710 Unilateral primary osteoarthritis, unspecified knee: Secondary | ICD-10-CM

## 2012-06-04 DIAGNOSIS — IMO0002 Reserved for concepts with insufficient information to code with codable children: Secondary | ICD-10-CM

## 2012-06-04 DIAGNOSIS — M179 Osteoarthritis of knee, unspecified: Secondary | ICD-10-CM

## 2012-06-04 LAB — TSH: TSH: 2.028 u[IU]/mL (ref 0.350–4.500)

## 2012-06-04 NOTE — Progress Notes (Signed)
Quick Note:  All labs are normal. ______ 

## 2012-06-04 NOTE — Telephone Encounter (Signed)
REFERRAL INFO For NCV order: I faxed referral to [facility] at [phone] and their phone is [phone] and ask that they schedule with patient for a good appt time.

## 2012-06-04 NOTE — Assessment & Plan Note (Signed)
Bilateral Supartz and steroid injections. Return in one week for Supartz injection #2 of 5 into both knees.

## 2012-06-04 NOTE — Addendum Note (Signed)
Addended by: Monica Becton on: 06/04/2012 06:09 PM   Modules accepted: Level of Service

## 2012-06-04 NOTE — Progress Notes (Signed)
Subjective:    I'm seeing this patient as a consultation for:  Dr. Linford Arnold  CC: Bilateral knee pain  HPI: This is a very pleasant 62 year old female who comes in with a long history of bilateral knee osteoarthritis. She had Synvisc injections approximately one year ago. She did very well and is wondering if we can repeat this. Pain is localized in both joint lines of both knees, worse with ambulation, better with rest. No mechanical symptoms, no radiation, moderate to severe. Stable.  Past medical history, Surgical history, Family history not pertinant except as noted below, Social history, Allergies, and medications have been entered into the medical record, reviewed, and no changes needed.   Review of Systems: No headache, visual changes, nausea, vomiting, diarrhea, constipation, dizziness, abdominal pain, skin rash, fevers, chills, night sweats, weight loss, swollen lymph nodes, body aches, joint swelling, muscle aches, chest pain, shortness of breath, mood changes, visual or auditory hallucinations.   Objective:   General: Well Developed, well nourished, and in no acute distress.  Neuro/Psych: Alert and oriented x3, extra-ocular muscles intact, able to move all 4 extremities, sensation grossly intact. Skin: Warm and dry, no rashes noted.  Respiratory: Not using accessory muscles, speaking in full sentences, trachea midline.  Cardiovascular: Pulses palpable, no extremity edema. Abdomen: Does not appear distended. Bilateral Knee: Minimal visible effusion, tender to palpation over the medial and lateral joint lines. ROM full in flexion and extension and lower leg rotation. Ligaments with solid consistent endpoints including ACL, PCL, LCL, MCL. Negative Mcmurray's, Apley's, and Thessalonian tests. Non painful patellar compression. Patellar glide without crepitus. Patellar and quadriceps tendons unremarkable. Hamstring and quadriceps strength is normal.   Procedure: Real-time  Ultrasound Guided Injection of left knee Device: GE Logiq E  Ultrasound guided injection is preferred based studies that show increased duration, increased effect, greater accuracy, decreased procedural pain, increased response rate, and decreased cost with ultrasound guided versus blind injection.  Verbal informed consent obtained.  Time-out conducted.  Noted no overlying erythema, induration, or other signs of local infection.  Skin prepped in a sterile fashion.  Local anesthesia: Topical Ethyl chloride.  With sterile technique and under real time ultrasound guidance:  Minimal effusion seen, 2 cc Kenalog 40, 4 cc lidocaine injected into the suprapatellar recess, then 25 mg/2.5 mL of Supartz (sodium hyaluronate) in a prefilled syringe was injected easily into the knee through a 22-gauge needle. Completed without difficulty  Pain immediately resolved suggesting accurate placement of the medication.  Advised to call if fevers/chills, erythema, induration, drainage, or persistent bleeding.  Images permanently stored and available for review in the ultrasound unit.  Impression: Technically successful ultrasound guided injection.  Procedure: Real-time Ultrasound Guided Injection of right knee Device: GE Logiq E  Ultrasound guided injection is preferred based studies that show increased duration, increased effect, greater accuracy, decreased procedural pain, increased response rate, and decreased cost with ultrasound guided versus blind injection.  Verbal informed consent obtained.  Time-out conducted.  Noted no overlying erythema, induration, or other signs of local infection.  Skin prepped in a sterile fashion.  Local anesthesia: Topical Ethyl chloride.  With sterile technique and under real time ultrasound guidance:  Minimal effusion seen, 2 cc Kenalog 40, 4 cc lidocaine injected into the suprapatellar recess, then 25 mg/2.5 mL of Supartz (sodium hyaluronate) in a prefilled syringe was injected  easily into the knee through a 22-gauge needle. Completed without difficulty  Pain immediately resolved suggesting accurate placement of the medication.  Advised to call if  fevers/chills, erythema, induration, drainage, or persistent bleeding.  Images permanently stored and available for review in the ultrasound unit.  Impression: Technically successful ultrasound guided injection. Impression and Recommendations:   This case required medical decision making of moderate complexity.

## 2012-06-11 ENCOUNTER — Ambulatory Visit (INDEPENDENT_AMBULATORY_CARE_PROVIDER_SITE_OTHER): Payer: Medicare Other | Admitting: Sports Medicine

## 2012-06-11 ENCOUNTER — Encounter: Payer: Self-pay | Admitting: Sports Medicine

## 2012-06-11 VITALS — BP 129/89 | HR 74 | Resp 16 | Wt 238.4 lb

## 2012-06-11 DIAGNOSIS — M1710 Unilateral primary osteoarthritis, unspecified knee: Secondary | ICD-10-CM

## 2012-06-11 DIAGNOSIS — IMO0002 Reserved for concepts with insufficient information to code with codable children: Secondary | ICD-10-CM

## 2012-06-11 DIAGNOSIS — M179 Osteoarthritis of knee, unspecified: Secondary | ICD-10-CM

## 2012-06-11 NOTE — Progress Notes (Signed)
  Procedure: Real-time Ultrasound Guided Injection of left knee Device: GE Logiq E  Ultrasound guided injection is preferred based studies that show increased duration, increased effect, greater accuracy, decreased procedural pain, increased response rate, and decreased cost with ultrasound guided versus blind injection.  Verbal informed consent obtained.  Time-out conducted.  Noted no overlying erythema, induration, or other signs of local infection.  Skin prepped in a sterile fashion.  Local anesthesia: Topical Ethyl chloride.  With sterile technique and under real time ultrasound guidance:  25 mg/2.5 mL of Supartz (sodium hyaluronate) in a prefilled syringe was injected easily into the knee through a 22-gauge needle. Completed without difficulty  Pain immediately resolved suggesting accurate placement of the medication.  Advised to call if fevers/chills, erythema, induration, drainage, or persistent bleeding.  Images permanently stored and available for review in the ultrasound unit.  Impression: Technically successful ultrasound guided injection.  Procedure: Real-time Ultrasound Guided Injection of right knee Device: GE Logiq E  Ultrasound guided injection is preferred based studies that show increased duration, increased effect, greater accuracy, decreased procedural pain, increased response rate, and decreased cost with ultrasound guided versus blind injection.  Verbal informed consent obtained.  Time-out conducted.  Noted no overlying erythema, induration, or other signs of local infection.  Skin prepped in a sterile fashion.  Local anesthesia: Topical Ethyl chloride.  With sterile technique and under real time ultrasound guidance:  25 mg/2.5 mL of Supartz (sodium hyaluronate) in a prefilled syringe was injected easily into the knee through a 22-gauge needle. Completed without difficulty  Pain immediately resolved suggesting accurate placement of the medication.  Advised to call if  fevers/chills, erythema, induration, drainage, or persistent bleeding.  Images permanently stored and available for review in the ultrasound unit.  Impression: Technically successful ultrasound guided injection.  

## 2012-06-11 NOTE — Assessment & Plan Note (Signed)
Shriley is now pain-free after her last injections. Supartz injection #2 of 5 given into both knees today. She will return in one week for #3 of 5.

## 2012-06-12 ENCOUNTER — Other Ambulatory Visit: Payer: Self-pay | Admitting: Family Medicine

## 2012-06-18 ENCOUNTER — Encounter: Payer: Self-pay | Admitting: Sports Medicine

## 2012-06-18 ENCOUNTER — Ambulatory Visit (INDEPENDENT_AMBULATORY_CARE_PROVIDER_SITE_OTHER): Payer: Medicare Other | Admitting: Sports Medicine

## 2012-06-18 VITALS — BP 139/92 | HR 79 | Wt 237.0 lb

## 2012-06-18 DIAGNOSIS — M179 Osteoarthritis of knee, unspecified: Secondary | ICD-10-CM

## 2012-06-18 DIAGNOSIS — IMO0002 Reserved for concepts with insufficient information to code with codable children: Secondary | ICD-10-CM

## 2012-06-18 DIAGNOSIS — M1710 Unilateral primary osteoarthritis, unspecified knee: Secondary | ICD-10-CM

## 2012-06-18 NOTE — Assessment & Plan Note (Signed)
Andrea Cisneros is now pain-free after her last injections. Supartz injection #3 of 5 given into both knees today. She will return in one week for #4 of 5.

## 2012-06-18 NOTE — Progress Notes (Signed)
  Procedure: Real-time Ultrasound Guided Injection of left knee Device: GE Logiq E  Ultrasound guided injection is preferred based studies that show increased duration, increased effect, greater accuracy, decreased procedural pain, increased response rate, and decreased cost with ultrasound guided versus blind injection.  Verbal informed consent obtained.  Time-out conducted.  Noted no overlying erythema, induration, or other signs of local infection.  Skin prepped in a sterile fashion.  Local anesthesia: Topical Ethyl chloride.  With sterile technique and under real time ultrasound guidance:  25 mg/2.5 mL of Supartz (sodium hyaluronate) in a prefilled syringe was injected easily into the knee through a 22-gauge needle. Completed without difficulty  Pain immediately resolved suggesting accurate placement of the medication.  Advised to call if fevers/chills, erythema, induration, drainage, or persistent bleeding.  Images permanently stored and available for review in the ultrasound unit.  Impression: Technically successful ultrasound guided injection.  Procedure: Real-time Ultrasound Guided Injection of right knee Device: GE Logiq E  Ultrasound guided injection is preferred based studies that show increased duration, increased effect, greater accuracy, decreased procedural pain, increased response rate, and decreased cost with ultrasound guided versus blind injection.  Verbal informed consent obtained.  Time-out conducted.  Noted no overlying erythema, induration, or other signs of local infection.  Skin prepped in a sterile fashion.  Local anesthesia: Topical Ethyl chloride.  With sterile technique and under real time ultrasound guidance:  25 mg/2.5 mL of Supartz (sodium hyaluronate) in a prefilled syringe was injected easily into the knee through a 22-gauge needle. Completed without difficulty  Pain immediately resolved suggesting accurate placement of the medication.  Advised to call if  fevers/chills, erythema, induration, drainage, or persistent bleeding.  Images permanently stored and available for review in the ultrasound unit.  Impression: Technically successful ultrasound guided injection.

## 2012-06-25 ENCOUNTER — Encounter: Payer: Self-pay | Admitting: Sports Medicine

## 2012-06-25 ENCOUNTER — Ambulatory Visit (INDEPENDENT_AMBULATORY_CARE_PROVIDER_SITE_OTHER): Payer: Medicare Other | Admitting: Sports Medicine

## 2012-06-25 VITALS — BP 151/93 | HR 99 | Wt 235.0 lb

## 2012-06-25 DIAGNOSIS — M1710 Unilateral primary osteoarthritis, unspecified knee: Secondary | ICD-10-CM

## 2012-06-25 DIAGNOSIS — M179 Osteoarthritis of knee, unspecified: Secondary | ICD-10-CM

## 2012-06-25 DIAGNOSIS — IMO0002 Reserved for concepts with insufficient information to code with codable children: Secondary | ICD-10-CM

## 2012-06-25 NOTE — Assessment & Plan Note (Signed)
Supartz injection number 4 of 5 placed into both knees today. Steroid placed into left knee as she was having some increased pain. Return in 1 week for Supartz injections number 5 into both knees.

## 2012-06-25 NOTE — Progress Notes (Addendum)
   Martena returns for Supartz injections #4 of 5 into both knees. She is pain-free now in her right knee, and has only a little pain in her left knee.  Procedure: Real-time Ultrasound Guided Injection of left knee Device: GE Logiq E  Ultrasound guided injection is preferred based studies that show increased duration, increased effect, greater accuracy, decreased procedural pain, increased response rate, and decreased cost with ultrasound guided versus blind injection.  Verbal informed consent obtained.  Time-out conducted.  Noted no overlying erythema, induration, or other signs of local infection.  Skin prepped in a sterile fashion.  Local anesthesia: Topical Ethyl chloride.  With sterile technique and under real time ultrasound guidance:  2 cc Kenalog 40, 4 cc lidocaine injected into the suprapatellar recess, then 25 mg/2.5 mL of Supartz (sodium hyaluronate) in a prefilled syringe was injected easily into the knee through a 22-gauge needle. Completed without difficulty  Pain immediately resolved suggesting accurate placement of the medication.  Advised to call if fevers/chills, erythema, induration, drainage, or persistent bleeding.  Images permanently stored and available for review in the ultrasound unit.  Impression: Technically successful ultrasound guided injection.  Procedure: Real-time Ultrasound Guided Injection of right knee Device: GE Logiq E  Ultrasound guided injection is preferred based studies that show increased duration, increased effect, greater accuracy, decreased procedural pain, increased response rate, and decreased cost with ultrasound guided versus blind injection.  Verbal informed consent obtained.  Time-out conducted.  Noted no overlying erythema, induration, or other signs of local infection.  Skin prepped in a sterile fashion.  Local anesthesia: Topical Ethyl chloride.  With sterile technique and under real time ultrasound guidance:  25 mg/2.5 mL of Supartz  (sodium hyaluronate) in a prefilled syringe was injected easily into the knee through a 22-gauge needle. Completed without difficulty  Pain immediately resolved suggesting accurate placement of the medication.  Advised to call if fevers/chills, erythema, induration, drainage, or persistent bleeding.  Images permanently stored and available for review in the ultrasound unit.  Impression: Technically successful ultrasound guided injection.

## 2012-06-27 ENCOUNTER — Other Ambulatory Visit: Payer: Self-pay | Admitting: Physician Assistant

## 2012-06-28 ENCOUNTER — Encounter: Payer: Self-pay | Admitting: Family Medicine

## 2012-06-28 DIAGNOSIS — G5603 Carpal tunnel syndrome, bilateral upper limbs: Secondary | ICD-10-CM | POA: Insufficient documentation

## 2012-07-02 ENCOUNTER — Ambulatory Visit (INDEPENDENT_AMBULATORY_CARE_PROVIDER_SITE_OTHER): Payer: Medicare Other | Admitting: Sports Medicine

## 2012-07-02 ENCOUNTER — Encounter: Payer: Self-pay | Admitting: Sports Medicine

## 2012-07-02 VITALS — BP 121/81 | HR 71 | Wt 231.0 lb

## 2012-07-02 DIAGNOSIS — IMO0002 Reserved for concepts with insufficient information to code with codable children: Secondary | ICD-10-CM

## 2012-07-02 DIAGNOSIS — M1710 Unilateral primary osteoarthritis, unspecified knee: Secondary | ICD-10-CM

## 2012-07-02 DIAGNOSIS — M179 Osteoarthritis of knee, unspecified: Secondary | ICD-10-CM

## 2012-07-02 NOTE — Assessment & Plan Note (Signed)
Supartz injection #5 of 5 into both knees. Return in one month.

## 2012-07-02 NOTE — Progress Notes (Signed)
   Andrea Cisneros returns for Supartz injections #5 of 5 and both knees, she is pain-free in both knees..  Procedure: Real-time Ultrasound Guided Injection of left knee  Device: GE Logiq E  Ultrasound guided injection is preferred based studies that show increased duration, increased effect, greater accuracy, decreased procedural pain, increased response rate, and decreased cost with ultrasound guided versus blind injection.  Verbal informed consent obtained.  Time-out conducted.  Noted no overlying erythema, induration, or other signs of local infection.  Skin prepped in a sterile fashion.  Local anesthesia: Topical Ethyl chloride.  With sterile technique and under real time ultrasound guidance: 2 cc Kenalog 40, 4 cc lidocaine injected into the suprapatellar recess, then 25 mg/2.5 mL of Supartz (sodium hyaluronate) in a prefilled syringe was injected easily into the knee through a 22-gauge needle.  Completed without difficulty  Pain immediately resolved suggesting accurate placement of the medication.  Advised to call if fevers/chills, erythema, induration, drainage, or persistent bleeding.  Images permanently stored and available for review in the ultrasound unit.  Impression: Technically successful ultrasound guided injection.  Procedure: Real-time Ultrasound Guided Injection of right knee  Device: GE Logiq E  Ultrasound guided injection is preferred based studies that show increased duration, increased effect, greater accuracy, decreased procedural pain, increased response rate, and decreased cost with ultrasound guided versus blind injection.  Verbal informed consent obtained.  Time-out conducted.  Noted no overlying erythema, induration, or other signs of local infection.  Skin prepped in a sterile fashion.  Local anesthesia: Topical Ethyl chloride.  With sterile technique and under real time ultrasound guidance: 25 mg/2.5 mL of Supartz (sodium hyaluronate) in a prefilled syringe was  injected easily into the knee through a 22-gauge needle.  Completed without difficulty  Pain immediately resolved suggesting accurate placement of the medication.  Advised to call if fevers/chills, erythema, induration, drainage, or persistent bleeding.  Images permanently stored and available for review in the ultrasound unit.  Impression: Technically successful ultrasound guided injection.

## 2012-07-18 ENCOUNTER — Encounter: Payer: Self-pay | Admitting: *Deleted

## 2012-07-22 ENCOUNTER — Other Ambulatory Visit: Payer: Self-pay | Admitting: Family Medicine

## 2012-07-22 ENCOUNTER — Ambulatory Visit (INDEPENDENT_AMBULATORY_CARE_PROVIDER_SITE_OTHER): Payer: Medicare Other | Admitting: Family Medicine

## 2012-07-22 ENCOUNTER — Encounter: Payer: Self-pay | Admitting: Family Medicine

## 2012-07-22 ENCOUNTER — Ambulatory Visit: Payer: Self-pay | Admitting: Family Medicine

## 2012-07-22 VITALS — BP 131/87 | HR 82 | Wt 236.0 lb

## 2012-07-22 DIAGNOSIS — N950 Postmenopausal bleeding: Secondary | ICD-10-CM

## 2012-07-22 DIAGNOSIS — R1084 Generalized abdominal pain: Secondary | ICD-10-CM

## 2012-07-22 DIAGNOSIS — I2699 Other pulmonary embolism without acute cor pulmonale: Secondary | ICD-10-CM

## 2012-07-22 DIAGNOSIS — E1159 Type 2 diabetes mellitus with other circulatory complications: Secondary | ICD-10-CM

## 2012-07-22 DIAGNOSIS — R112 Nausea with vomiting, unspecified: Secondary | ICD-10-CM

## 2012-07-22 DIAGNOSIS — D510 Vitamin B12 deficiency anemia due to intrinsic factor deficiency: Secondary | ICD-10-CM

## 2012-07-22 LAB — POCT GLYCOSYLATED HEMOGLOBIN (HGB A1C): Hemoglobin A1C: 6.9

## 2012-07-22 LAB — POCT INR: INR: 3.5

## 2012-07-22 MED ORDER — CITALOPRAM HYDROBROMIDE 20 MG PO TABS: 20.0000 mg | ORAL_TABLET | Freq: Every day | ORAL | Status: DC

## 2012-07-22 MED ORDER — CYANOCOBALAMIN 1000 MCG/ML IJ SOLN
1000.0000 ug | Freq: Once | INTRAMUSCULAR | Status: AC
  Administered 2012-07-22: 1000 ug via INTRAMUSCULAR

## 2012-07-22 NOTE — Progress Notes (Signed)
  Subjective:    Patient ID: Andrea Cisneros, female    DOB: December 08, 1950, 62 y.o.   MRN: [id number]  HPI Started vomiting about a week ago.  Has abdominal pain that is Diffuse.  Stools have been soft but hard to move. She says she feels horrible and she can move her bowels. She's wondering if she might have some diverticulitis.  No fever.  Feels weak.  Says has been taking her meds regulraly.  She has had a decreased appetite. She says she tends to vomit when she uses the MiraLax as well but this is been going on for more than a week. That's been going on for several months.  Had a period last month and bleed for about 5 days.  No cramping. Hasn't had a period since she was 62 yr old.  Says its was light.   DM- Taking her metformin regularly.  Says she is concerned bc has gained 3 lbs.    No hypoglycemic events.  No wounds that aren't healing well.    Pernicious anemia-she really would like a B12 shot today because she felt really weak. She has been a while since she's had one.  Coumadin-due for INR today. She is supratherapeutic. She denies any bleeding.  Review of Systems     Objective:   Physical Exam  Constitutional: She is oriented to person, place, and time. She appears well-developed and well-nourished.  HENT:  Head: Normocephalic and atraumatic.  Cardiovascular: Normal rate, regular rhythm and normal heart sounds.   Pulmonary/Chest: Effort normal and breath sounds normal.  Abdominal: Soft. Bowel sounds are normal. She exhibits no distension and no mass. There is tenderness. There is no rebound and no guarding.  Tender in teh RUQ and LUQ.   Neurological: She is alert and oriented to person, place, and time.  Skin: Skin is warm and dry.  Psychiatric: She has a normal mood and affect. Her behavior is normal.          Assessment & Plan:  DM - A1c looks fantastic today. Well controlled. Foot exam performed today. Followup in 3 months. Continue the metformin as  tolerated.  Chronic constipation -I was hoping that the metformin for diabetes actually help with her chronic constipation. It has not. She has been using the MiraLax and says over the last several months she says she vomits it back up after she takes it. I will give her some samples of Amitiza 24 mcg twice a day to see if we can get her bowel moving her the next couple days. It works well this may be a good chronic regimen for her instead of the MiraLax.  Abdominal pain-she's tender in the right upper quadrant and left upper quadrants and has had significant nausea. This could be secondary to the metformin but I'm worried that there may be something else going on. Either diverticulitis or something else going on with her uterus or ovaries in she had a period last month. Will order ABdominal/Pelvic CT w contrast.  History of diverticulitis.  Postmenopausal bleeding  - initially is going to set her up for an ultrasound of the pelvis but since we're going to get a CT of the abdomen we will just add pelvis to that and we will make sure everything looks okay. Very concerned about postmenopausal bleeding in someone who has not had a period in almost 10 years. We will also schedule her for a pelvic and Pap smear on Friday.  Pernicious anemia-we'll give B12 injection today.

## 2012-07-23 ENCOUNTER — Telehealth: Payer: Self-pay | Admitting: *Deleted

## 2012-07-23 LAB — CBC WITH DIFFERENTIAL/PLATELET
Hemoglobin: 14.6 g/dL (ref 12.0–15.0)
Lymphs Abs: 1.8 10*3/uL (ref 0.7–4.0)
MCH: 31.7 pg (ref 26.0–34.0)
Monocytes Relative: 13 % — ABNORMAL HIGH (ref 3–12)
Neutro Abs: 1.3 10*3/uL — ABNORMAL LOW (ref 1.7–7.7)
Neutrophils Relative %: 34 % — ABNORMAL LOW (ref 43–77)
RBC: 4.61 MIL/uL (ref 3.87–5.11)

## 2012-07-23 LAB — COMPLETE METABOLIC PANEL WITH GFR
Albumin: 3.8 g/dL (ref 3.5–5.2)
CO2: 23 mEq/L (ref 19–32)
Chloride: 107 mEq/L (ref 96–112)
GFR, Est African American: 84 mL/min
GFR, Est Non African American: 73 mL/min
Glucose, Bld: 122 mg/dL — ABNORMAL HIGH (ref 70–99)
Potassium: 3.8 mEq/L (ref 3.5–5.3)
Sodium: 140 mEq/L (ref 135–145)
Total Protein: 7.1 g/dL (ref 6.0–8.3)

## 2012-07-23 NOTE — Telephone Encounter (Signed)
Prior auth obtained for CT abd/pelvis w CM.  Auth # is [id number].  Eber Jones in Imaging at [facility] notified.

## 2012-07-25 ENCOUNTER — Ambulatory Visit (HOSPITAL_BASED_OUTPATIENT_CLINIC_OR_DEPARTMENT_OTHER): Payer: Medicare Other

## 2012-07-30 ENCOUNTER — Encounter: Payer: Self-pay | Admitting: Sports Medicine

## 2012-07-30 ENCOUNTER — Ambulatory Visit (INDEPENDENT_AMBULATORY_CARE_PROVIDER_SITE_OTHER): Payer: Medicare Other | Admitting: Sports Medicine

## 2012-07-30 ENCOUNTER — Ambulatory Visit (HOSPITAL_BASED_OUTPATIENT_CLINIC_OR_DEPARTMENT_OTHER): Payer: Medicare Other

## 2012-07-30 VITALS — BP 142/95 | HR 74 | Wt 238.0 lb

## 2012-07-30 DIAGNOSIS — M1710 Unilateral primary osteoarthritis, unspecified knee: Secondary | ICD-10-CM

## 2012-07-30 DIAGNOSIS — IMO0002 Reserved for concepts with insufficient information to code with codable children: Secondary | ICD-10-CM

## 2012-07-30 DIAGNOSIS — M179 Osteoarthritis of knee, unspecified: Secondary | ICD-10-CM

## 2012-07-30 NOTE — Progress Notes (Signed)
  Subjective:    CC: Followup  HPI: Bilateral knee osteoarthritis: Finished Supartz series in both knees last month, right knee is doing excellent, left knee still has some pain at the medial joint line. No mechanical symptoms. Pain is localized, doesn't radiate, moderate.  Past medical history, Surgical history, Family history not pertinant except as noted below, Social history, Allergies, and medications have been entered into the medical record, reviewed, and no changes needed.   Review of Systems: No fevers, chills, night sweats, weight loss, chest pain, or shortness of breath.   Objective:    General: Well Developed, well nourished, and in no acute distress.  Neuro: Alert and oriented x3, extra-ocular muscles intact, sensation grossly intact.  HEENT: Normocephalic, atraumatic, pupils equal round reactive to light, neck supple, no masses, no lymphadenopathy, thyroid nonpalpable.  Skin: Warm and dry, no rashes. Cardiac: Regular rate and rhythm, no murmurs rubs or gallops, no lower extremity edema.  Respiratory: Clear to auscultation bilaterally. Not using accessory muscles, speaking in full sentences. Left Knee: Normal to inspection with no erythema or effusion or obvious bony abnormalities. Tender to palpation at the medial joint line. ROM full in flexion and extension and lower leg rotation. Ligaments with solid consistent endpoints including ACL, PCL, LCL, MCL. Negative Mcmurray's, Apley's, and Thessalonian tests. Non painful patellar compression. Patellar glide without crepitus. Patellar and quadriceps tendons unremarkable. Hamstring and quadriceps strength is normal.   Procedure:  Injection of left knee Consent obtained and verified. Time-out conducted. Noted no overlying erythema, induration, or other signs of local infection. Skin prepped in a sterile fashion. Topical analgesic spray: Ethyl chloride. Completed without difficulty. Meds: 2 cc Kenalog 40, 4 cc lidocaine  injected easily just inferior and medial to the patellar pole. Pain immediately improved suggesting accurate placement of the medication. Advised to call if fevers/chills, erythema, induration, drainage, or persistent bleeding. Impression and Recommendations:

## 2012-07-30 NOTE — Assessment & Plan Note (Signed)
Bilateral. Right knee is pain-free after Supartz series. Left knee still has some pain after series, injected today with steroid. She will come see me in a month, if still with pain we certainly need to consider surgical intervention. I did some x-rays, she will get them done tomorrow in [facility].

## 2012-07-31 ENCOUNTER — Ambulatory Visit (HOSPITAL_BASED_OUTPATIENT_CLINIC_OR_DEPARTMENT_OTHER): Payer: Medicare Other

## 2012-08-05 ENCOUNTER — Encounter: Payer: Self-pay | Admitting: Family Medicine

## 2012-08-05 ENCOUNTER — Ambulatory Visit (INDEPENDENT_AMBULATORY_CARE_PROVIDER_SITE_OTHER): Payer: Medicare Other | Admitting: Family Medicine

## 2012-08-05 ENCOUNTER — Other Ambulatory Visit (HOSPITAL_COMMUNITY)
Admission: RE | Admit: 2012-08-05 | Discharge: 2012-08-05 | Disposition: A | Discharge status: Home / Self Care | Payer: Medicare Other | Source: Ambulatory Visit | Attending: Family Medicine | Admitting: Family Medicine

## 2012-08-05 ENCOUNTER — Other Ambulatory Visit: Payer: Self-pay | Admitting: *Deleted

## 2012-08-05 ENCOUNTER — Ambulatory Visit: Payer: Self-pay | Admitting: Family Medicine

## 2012-08-05 VITALS — BP 121/88 | HR 66 | Wt 237.0 lb

## 2012-08-05 DIAGNOSIS — R109 Unspecified abdominal pain: Secondary | ICD-10-CM

## 2012-08-05 DIAGNOSIS — N950 Postmenopausal bleeding: Secondary | ICD-10-CM

## 2012-08-05 DIAGNOSIS — Z01419 Encounter for gynecological examination (general) (routine) without abnormal findings: Secondary | ICD-10-CM | POA: Insufficient documentation

## 2012-08-05 DIAGNOSIS — I10 Essential (primary) hypertension: Secondary | ICD-10-CM

## 2012-08-05 DIAGNOSIS — Z1151 Encounter for screening for human papillomavirus (HPV): Secondary | ICD-10-CM | POA: Insufficient documentation

## 2012-08-05 DIAGNOSIS — I2699 Other pulmonary embolism without acute cor pulmonale: Secondary | ICD-10-CM

## 2012-08-05 MED ORDER — DIAZEPAM 2 MG PO TABS: ORAL_TABLET | ORAL | Status: DC

## 2012-08-05 NOTE — Progress Notes (Signed)
  Subjective:    Patient ID: Andrea Cisneros, female    DOB: [birth year], 62 y.o.   MRN: [id number]  HPI Here to followup for abnormal bleeding. Her last period was at age 84 and about a month ago she had a normal period for 5 days. She says she even passed clots. This is never happened before. No significant pelvic or abdominal pain. She's here for an exam and Pap smear. She says she was contacted about the CT of the abdomen and pelvis but did not have it done because her cost was going to be $500.  She's going to start working next Monday. She will return for a month and then after that she will work 3-4 days per week.   HX of PE - needs INR check today. No bleeding.  Review of Systems     Objective:   Physical Exam  Genitourinary:    She has an approximately 1 cm ulcerated lesion at 6:00 position on the cervix. To the left around the 9:00 position there is a small papule most likely consistent with a cystocele. She has a separate smaller lesion towards the 4:00 position that looks more consistent with a small approximately half a centimeter contusion or hematoma.          Assessment & Plan:  Postmenopausal bleeding-will contact her with the results once available. I did encourage her to follow through with a CT especially before she starts work so that we can investigate this further. We will call and see if an ultrasound would be a better alternative and may be more cost effective.  HTN - Well controlled on repeat BP check.  She felt really anxious when first got here as saw a bug at [facility]   Hx of PE - See anticoag flowsheet.

## 2012-08-07 ENCOUNTER — Ambulatory Visit (HOSPITAL_BASED_OUTPATIENT_CLINIC_OR_DEPARTMENT_OTHER): Payer: Medicare Other

## 2012-08-07 ENCOUNTER — Other Ambulatory Visit (HOSPITAL_BASED_OUTPATIENT_CLINIC_OR_DEPARTMENT_OTHER): Payer: Medicare Other

## 2012-08-08 ENCOUNTER — Ambulatory Visit (HOSPITAL_BASED_OUTPATIENT_CLINIC_OR_DEPARTMENT_OTHER): Payer: Medicare Other

## 2012-08-08 ENCOUNTER — Other Ambulatory Visit (HOSPITAL_BASED_OUTPATIENT_CLINIC_OR_DEPARTMENT_OTHER): Payer: Medicare Other

## 2012-08-10 ENCOUNTER — Ambulatory Visit (HOSPITAL_BASED_OUTPATIENT_CLINIC_OR_DEPARTMENT_OTHER): Payer: Medicare Other

## 2012-08-10 ENCOUNTER — Other Ambulatory Visit: Payer: Self-pay | Admitting: Sports Medicine

## 2012-08-10 ENCOUNTER — Ambulatory Visit (HOSPITAL_BASED_OUTPATIENT_CLINIC_OR_DEPARTMENT_OTHER)
Admission: RE | Admit: 2012-08-10 | Discharge: 2012-08-10 | Disposition: A | Discharge status: Home / Self Care | Payer: Medicare Other | Source: Ambulatory Visit | Attending: Family Medicine | Admitting: Family Medicine

## 2012-08-10 ENCOUNTER — Ambulatory Visit (HOSPITAL_BASED_OUTPATIENT_CLINIC_OR_DEPARTMENT_OTHER)
Admission: RE | Admit: 2012-08-10 | Discharge: 2012-08-10 | Disposition: A | Discharge status: Home / Self Care | Payer: Medicare Other | Source: Ambulatory Visit | Attending: Sports Medicine | Admitting: Sports Medicine

## 2012-08-10 DIAGNOSIS — N950 Postmenopausal bleeding: Secondary | ICD-10-CM

## 2012-08-10 DIAGNOSIS — D259 Leiomyoma of uterus, unspecified: Secondary | ICD-10-CM | POA: Insufficient documentation

## 2012-08-10 DIAGNOSIS — N9489 Other specified conditions associated with female genital organs and menstrual cycle: Secondary | ICD-10-CM | POA: Insufficient documentation

## 2012-08-10 DIAGNOSIS — M1710 Unilateral primary osteoarthritis, unspecified knee: Secondary | ICD-10-CM | POA: Insufficient documentation

## 2012-08-13 ENCOUNTER — Ambulatory Visit (INDEPENDENT_AMBULATORY_CARE_PROVIDER_SITE_OTHER): Payer: Medicare Other | Admitting: Sports Medicine

## 2012-08-13 ENCOUNTER — Encounter: Payer: Self-pay | Admitting: Sports Medicine

## 2012-08-13 VITALS — BP 132/83 | HR 72 | Wt 236.0 lb

## 2012-08-13 DIAGNOSIS — M1710 Unilateral primary osteoarthritis, unspecified knee: Secondary | ICD-10-CM

## 2012-08-13 DIAGNOSIS — M179 Osteoarthritis of knee, unspecified: Secondary | ICD-10-CM

## 2012-08-13 DIAGNOSIS — IMO0002 Reserved for concepts with insufficient information to code with codable children: Secondary | ICD-10-CM

## 2012-08-13 MED ORDER — TRAMADOL HCL 50 MG PO TABS: 50.0000 mg | ORAL_TABLET | Freq: Three times a day (TID) | ORAL | Status: DC | PRN

## 2012-08-13 MED ORDER — DICLOFENAC SODIUM 1 % TD GEL: 4.0000 g | Freq: Four times a day (QID) | TRANSDERMAL | Status: AC

## 2012-08-13 NOTE — Progress Notes (Signed)
  Subjective:    CC: Followup  HPI: Bilateral knee osteoarthritis: right knee is pain-free after Supartz series, left knee still had some pain and we injected steroids the last visit. She is overall pain-free, does get a small amount of gelling, but otherwise no complaints. She's not taking any oral medication her topical medication for this. Pain is localized, at the joint line, doesn't radiate, mild.  Past medical history, Surgical history, Family history not pertinant except as noted below, Social history, Allergies, and medications have been entered into the medical record, reviewed, and no changes needed.   Review of Systems: No fevers, chills, night sweats, weight loss, chest pain, or shortness of breath.   Objective:    General: Well Developed, well nourished, and in no acute distress.  Neuro: Alert and oriented x3, extra-ocular muscles intact, sensation grossly intact.  HEENT: Normocephalic, atraumatic, pupils equal round reactive to light, neck supple, no masses, no lymphadenopathy, thyroid nonpalpable.  Skin: Warm and dry, no rashes. Cardiac: Regular rate and rhythm, no murmurs rubs or gallops, no lower extremity edema.  Respiratory: Clear to auscultation bilaterally. Not using accessory muscles, speaking in full sentences. Bilateral Knee: Normal to inspection with no erythema or effusion or obvious bony abnormalities. Palpation normal with no warmth, joint line tenderness, patellar tenderness, or condyle tenderness. ROM full in flexion and extension and lower leg rotation. Ligaments with solid consistent endpoints including ACL, PCL, LCL, MCL. Negative Mcmurray's, Apley's, and Thessalonian tests. Non painful patellar compression. Patellar glide without crepitus. Patellar and quadriceps tendons unremarkable. Hamstring and quadriceps strength is normal.  Impression and Recommendations:

## 2012-08-13 NOTE — Assessment & Plan Note (Signed)
Right knee continued to be pain-free after Supartz series. Left knee had a little bit pain, I reinjected her at the last visit, and pain is now resolved. She continues to have a small amount of gelling in the morning, I am going to add tramadol, and topical diclofenac, as she is on Coumadin. Return to see me in one to 2 months.

## 2012-08-17 ENCOUNTER — Ambulatory Visit (HOSPITAL_BASED_OUTPATIENT_CLINIC_OR_DEPARTMENT_OTHER): Payer: Medicare Other

## 2012-08-22 ENCOUNTER — Other Ambulatory Visit: Payer: Self-pay

## 2012-09-01 ENCOUNTER — Other Ambulatory Visit: Payer: Self-pay | Admitting: Family Medicine

## 2012-09-02 ENCOUNTER — Encounter: Payer: Self-pay | Admitting: Family Medicine

## 2012-09-03 ENCOUNTER — Encounter: Payer: Self-pay | Admitting: Family Medicine

## 2012-09-03 ENCOUNTER — Ambulatory Visit: Payer: Medicare Other | Admitting: Sports Medicine

## 2012-09-03 ENCOUNTER — Ambulatory Visit: Payer: Medicare Other | Admitting: Family Medicine

## 2012-09-03 ENCOUNTER — Ambulatory Visit (INDEPENDENT_AMBULATORY_CARE_PROVIDER_SITE_OTHER): Payer: Medicare Other | Admitting: Family Medicine

## 2012-09-03 ENCOUNTER — Ambulatory Visit: Payer: Medicare Other | Admitting: Obstetrics & Gynecology

## 2012-09-03 ENCOUNTER — Ambulatory Visit: Payer: Self-pay | Admitting: Family Medicine

## 2012-09-03 VITALS — BP 128/81 | HR 59 | Wt 239.0 lb

## 2012-09-03 DIAGNOSIS — I2699 Other pulmonary embolism without acute cor pulmonale: Secondary | ICD-10-CM

## 2012-09-03 DIAGNOSIS — M79609 Pain in unspecified limb: Secondary | ICD-10-CM

## 2012-09-03 DIAGNOSIS — N950 Postmenopausal bleeding: Secondary | ICD-10-CM

## 2012-09-03 DIAGNOSIS — M79602 Pain in left arm: Secondary | ICD-10-CM

## 2012-09-03 DIAGNOSIS — IMO0002 Reserved for concepts with insufficient information to code with codable children: Secondary | ICD-10-CM

## 2012-09-03 DIAGNOSIS — S46812S Strain of other muscles, fascia and tendons at shoulder and upper arm level, left arm, sequela: Secondary | ICD-10-CM

## 2012-09-03 DIAGNOSIS — M79601 Pain in right arm: Secondary | ICD-10-CM

## 2012-09-03 LAB — POCT INR: INR: 2.3

## 2012-09-03 MED ORDER — DIAZEPAM 2 MG PO TABS: ORAL_TABLET | ORAL | Status: DC

## 2012-09-03 MED ORDER — BACLOFEN 10 MG PO TABS: 10.0000 mg | ORAL_TABLET | Freq: Every evening | ORAL | Status: DC | PRN

## 2012-09-03 NOTE — Progress Notes (Signed)
Subjective:    Patient ID: Andrea Cisneros, female    DOB: January 30, 1951, 62 y.o.   MRN: [id number]  HPI Hosp f/u for weakness and pain in the left arm. Was radiating to her chest as well.  Started on 08/27/12. Went to [facility].  Had neg stress test cardiolite ib 08/28/2012.  They felt likely MSK.   Still painful to lay on her left side and shoulder. Has been starting her back and then radiating down her left arm.  Pain in the 3rd, 4th, and 5th digits.  Feel tingling and painful.  Decreassed strength in that arm.  Pan comes and goes.  Hard to fall asleep at night.  Hard to braid or wash her hair. She has not tried heat or ice.   Review of Systems  BP 128/81  Pulse 59  Wt 239 lb (108.41 kg)  BMI 36.77 kg/m2    Allergies  Allergen Reactions  . Lisinopril     REACTION: swelling and angioedema  . Lyrica (Pregabalin)     Upset stomach  . Shellfish Allergy     Past Medical History  Diagnosis Date  . Normal cardiac stress test 08/29/2010    [facility], [place]    Past Surgical History  Procedure Laterality Date  . Partial colectomy for diverticulitis    . Tubal ligation    . Right arm    . Gastric bypass      > 21    History   Social History  . Marital Status: Divorced    Spouse Name: N/A    Number of Children: N/A  . Years of Education: N/A   Occupational History  . Not on file.   Social History Main Topics  . Smoking status: Light Tobacco Smoker    Types: Cigarettes  . Smokeless tobacco: Not on file  . Alcohol Use: Yes  . Drug Use: No  . Sexually Active: Not on file   Other Topics Concern  . Not on file   Social History Narrative  . No narrative on file    Family History  Problem Relation Age of Onset  . Heart attack Mother   . Diabetes Brother     Outpatient Encounter Prescriptions as of 09/03/2012  Medication Sig Dispense Refill  . albuterol (PROVENTIL HFA) 108 (90 BASE) MCG/ACT inhaler Inhale into the lungs as directed.  1 Inhaler   3  . atenolol (TENORMIN) 50 MG tablet Take 1 tablet (50 mg total) by mouth daily.  90 tablet  1  . citalopram (CELEXA) 20 MG tablet Take 1 tablet (20 mg total) by mouth daily.  90 tablet  1  . clobetasol ointment (TEMOVATE) 0.05 % Apply topically daily as needed.  45 g  1  . diazepam (VALIUM) 2 MG tablet TAKE ONE TABLET BY MOUTH EVERY 8 HOURS AS NEEDED FOR ANXIETY  90 tablet  0  . diclofenac sodium (VOLTAREN) 1 % GEL Apply 4 g topically 4 (four) times daily. To affected joint.  100 g  11  . gabapentin (NEURONTIN) 100 MG capsule One at bedtime for 1 week, then BID for one week, then increase to TID  90 capsule  0  . metFORMIN (GLUCOPHAGE) 500 MG tablet Take 1 tablet (500 mg total) by mouth 2 (two) times daily with a meal.  60 tablet  3  . polyethylene glycol powder (GLYCOLAX/MIRALAX) powder USE AS DIRECTED TWICE DAILY AS NEEDED  527 g  0  . traMADol (ULTRAM) 50 MG tablet Take  1 tablet (50 mg total) by mouth every 8 (eight) hours as needed for pain.  50 tablet  2  . triamcinolone ointment (KENALOG) 0.5 % APPLY  TO AFFECTED AREA TWICE DAILY  30 g  0  . verapamil (CALAN-SR) 120 MG CR tablet Take 1 tablet (120 mg total) by mouth daily.  30 tablet  11  . verapamil (CALAN-SR) 240 MG CR tablet Take 1 tablet (240 mg total) by mouth at bedtime.  30 tablet  6  . warfarin (COUMADIN) 4 MG tablet TAKE 1 TABLET BY MOUTH ON SUNDAY, TUESDAY, WEDNESDAY, FRIDAY, SATURDAY, THEN TAKE ONE AND ONE-HALF TABLET ON MONDAY AND THURSDAY  30 tablet  5  . warfarin (COUMADIN) 6 MG tablet Take 1 tablet (6 mg total) by mouth daily.  30 tablet  5  . [DISCONTINUED] diazepam (VALIUM) 2 MG tablet TAKE ONE TABLET BY MOUTH EVERY 8 HOURS AS NEEDED FOR ANXIETY  90 tablet  0  . baclofen (LIORESAL) 10 MG tablet Take 1 tablet (10 mg total) by mouth at bedtime as needed.  30 each  0   No facility-administered encounter medications on file as of 09/03/2012.          Objective:   Physical Exam  Constitutional: She appears well-developed  and well-nourished.  Musculoskeletal:  Neck with normal flexion. Pain with it extension. Normal rotation right and left. Normal side bending right and left. She's very tender over the left trapezius. Nontender of the actual shoulder joint. Normal range of motion left shoulder. No pain or tenderness over the wrist with normal flexion and extension. No swelling of the hands or fingers. Strength is symmetric in the shoulder, elbow, wrist, fingers.  Skin: Skin is warm and dry.  Psychiatric: She has a normal mood and affect. Her behavior is normal.        A &P   Left arm pain - I do think that this could be pain radiating from the trapezius strain causing discomfort. Odor for muscle relaxer to take at bedtime since she has had some problems going to sleep because of the discomfort. The anti-inflammatory, Naprosyn does seem to be helping so this is reassuring as well. Work on gentle stretches. Recommend applying heat for 15-20 minutes a couple times a day as needed.she does have some pain in the C8 distribution so would like to get an x-ray of her neck. She does have a history of arthritis in her back and knees.  Trapezius strain - will add muscle relaxer at bedtime to see if this helps. Work on [employer] and applying heat.   PE- see intake anticoagulation flowsheet for adjustments to Coumadin. She was in the therapeutic window today.  Postmenopausal bleeding-she has not had any more bleeding since I last saw her. She did go for ultrasound we did pick up on some fibroids. She has not been contacted yet to schedule a endometrial biopsy. I will have a referral coordinator call today to get this appointment for her as ASAP. She said they attempted to do an endometrial biopsy about a year ago and they were unable to do it secondary to discomfort and possibly some cervical changes. I will refer her back and they may need to consider sedation to be able to get the samples but she needs to have this  done.

## 2012-09-05 ENCOUNTER — Ambulatory Visit: Payer: Medicare Other | Admitting: Obstetrics & Gynecology

## 2012-09-11 ENCOUNTER — Telehealth: Payer: Self-pay

## 2012-09-11 NOTE — Telephone Encounter (Signed)
Spoke with Mrs. Dante to schedule appointment for the PMB and patient states she was in training for 2 weeks and will call when done with training but also let me know that she did not want to have the endo bx unless we can put her to sleep because it hurt too much and she did not want to go through that again. I advised patient that we do not do that and will talk to Dr. Linford Arnold to see if there are other options for her.

## 2012-09-11 NOTE — Telephone Encounter (Signed)
That is ultimately up to the OB/GYN to decide if sedation is okay for the biopsy or not. But she needs to at least have a consult with the GYN to see what the next step is

## 2012-09-26 ENCOUNTER — Other Ambulatory Visit: Payer: Self-pay | Admitting: Family Medicine

## 2012-10-03 ENCOUNTER — Other Ambulatory Visit: Payer: Self-pay | Admitting: *Deleted

## 2012-10-03 DIAGNOSIS — M1710 Unilateral primary osteoarthritis, unspecified knee: Secondary | ICD-10-CM

## 2012-10-03 MED ORDER — WARFARIN SODIUM 6 MG PO TABS: 6.0000 mg | ORAL_TABLET | Freq: Every day | ORAL | Status: DC

## 2012-10-03 MED ORDER — TRAMADOL HCL 50 MG PO TABS: 50.0000 mg | ORAL_TABLET | Freq: Three times a day (TID) | ORAL | Status: DC | PRN

## 2012-10-03 MED ORDER — DIAZEPAM 2 MG PO TABS: ORAL_TABLET | ORAL | Status: DC

## 2012-10-07 ENCOUNTER — Other Ambulatory Visit: Payer: Self-pay | Admitting: Family Medicine

## 2012-10-07 ENCOUNTER — Ambulatory Visit: Payer: Medicare Other

## 2012-10-21 ENCOUNTER — Ambulatory Visit (INDEPENDENT_AMBULATORY_CARE_PROVIDER_SITE_OTHER): Payer: Medicare Other | Admitting: Family Medicine

## 2012-10-21 ENCOUNTER — Ambulatory Visit: Payer: Self-pay | Admitting: Family Medicine

## 2012-10-21 ENCOUNTER — Encounter: Payer: Self-pay | Admitting: Family Medicine

## 2012-10-21 VITALS — BP 139/98 | HR 72 | Wt 240.0 lb

## 2012-10-21 DIAGNOSIS — M1712 Unilateral primary osteoarthritis, left knee: Secondary | ICD-10-CM

## 2012-10-21 DIAGNOSIS — E1159 Type 2 diabetes mellitus with other circulatory complications: Secondary | ICD-10-CM

## 2012-10-21 DIAGNOSIS — I2699 Other pulmonary embolism without acute cor pulmonale: Secondary | ICD-10-CM

## 2012-10-21 DIAGNOSIS — Z780 Asymptomatic menopausal state: Secondary | ICD-10-CM

## 2012-10-21 DIAGNOSIS — Z23 Encounter for immunization: Secondary | ICD-10-CM

## 2012-10-21 DIAGNOSIS — M1711 Unilateral primary osteoarthritis, right knee: Secondary | ICD-10-CM

## 2012-10-21 DIAGNOSIS — M1710 Unilateral primary osteoarthritis, unspecified knee: Secondary | ICD-10-CM

## 2012-10-21 DIAGNOSIS — I10 Essential (primary) hypertension: Secondary | ICD-10-CM

## 2012-10-21 LAB — POCT INR: INR: 2.2

## 2012-10-21 LAB — POCT GLYCOSYLATED HEMOGLOBIN (HGB A1C): Hemoglobin A1C: 6.1

## 2012-10-21 MED ORDER — BACLOFEN 10 MG PO TABS: ORAL_TABLET | ORAL | Status: DC

## 2012-10-21 MED ORDER — METFORMIN HCL 500 MG PO TABS: 500.0000 mg | ORAL_TABLET | Freq: Two times a day (BID) | ORAL | Status: DC

## 2012-10-21 MED ORDER — CLOBETASOL PROPIONATE 0.05 % EX OINT: TOPICAL_OINTMENT | Freq: Every day | CUTANEOUS | Status: AC | PRN

## 2012-10-21 MED ORDER — GABAPENTIN 100 MG PO CAPS: ORAL_CAPSULE | ORAL | Status: DC

## 2012-10-21 NOTE — Progress Notes (Signed)
  Subjective:    Patient ID: Andrea Cisneros, female    DOB: 1950/03/24, 62 y.o.   MRN: [id number]  HPI DM - has been 3 months since last followup. Taking her metformin. Hasn't been able to execise bc of her work schedule. No hypoglycemic events. No wounds that are not healing well. She says her last eye exam was about 8 years ago. She's also due for your micron been them but is not sure she's able to give a sample today.  Postmenopausal bleeding-had referred to GYN for endometrial biopsy.She says she spoke with them   Has been trying to work full-time but says she is not doing well. Has been working from home on the computer.  She has been having a lot of pain in both shoulder. In the right arm pain radiates from the right shoulder down into her right arm.  She is right handed.  She is also worried about being sedentary because of her PE history. She is on coumadin. Due for coumadin check today.  Her legs have been really swelling. Worse by the end of the days.  Her carpal tunnel is severe and getting pain and tingling in her right hand. She has severe OA of both knees and is a candidate for knee replacement. She has been trying to save up money to have this done.    HTN-  Pt denies chest pain, dizziness, or heart palpitations.  Taking meds as directed w/o problems.  Denies medication side effects.    Review of Systems     Objective:   Physical Exam  Constitutional: She is oriented to person, place, and time. She appears well-developed and well-nourished.  HENT:  Head: Normocephalic and atraumatic.  Cardiovascular: Normal rate, regular rhythm and normal heart sounds.   Pulmonary/Chest: Effort normal and breath sounds normal.  Neurological: She is alert and oriented to person, place, and time.  Skin: Skin is warm and dry.  Psychiatric: She has a normal mood and affect. Her behavior is normal.          Assessment & Plan:  DM- Well controlled. RF sent to pharmacy.  F/U in 3 months . Due  for CMP and lipids.  HTN - Uncontrolled.  She is very upset and tearful today.  Normally well controlled. Will monitor.    OA - With her hx of OA, HTN, PE, and carpal tunnel recommend reduced work hours. Will complete papework. No more than 24 hours per week.  No sitting for mor than 40-45 min at a time.  No standing for more than 10 minutes. No bending or stooping.  Postmenopausal bleeding-Refer back to gyn.  They tried to do a bx about 3 years ago and she didn't tolerate it.  She is worried that they won't sedate her.  Stressed her the importance of getting back in with GYN. Now she will be working reduced hours I strongly encouraged her to schedule this ASAP. She's had postmenopausal bleeding for at least 3 years now.  PE - see anticoagulation flowsheet for adjustments to coumadin.   Tobacco abuse-encourage cessation handout given.  Tdap and  pneumonia vaccine today.  Flu vaccine declined.

## 2012-10-21 NOTE — Patient Instructions (Addendum)
Remember to get your diabetic eye exam yearly.     Smoking Cessation, Tips for Success YOU CAN QUIT SMOKING If you are ready to quit smoking, congratulations! You have chosen to help yourself be healthier. Cigarettes bring nicotine, tar, carbon monoxide, and other irritants into your body. Your lungs, heart, and blood vessels will be able to work better without these poisons. There are many different ways to quit smoking. Nicotine gum, nicotine patches, a nicotine inhaler, or nicotine nasal spray can help with physical craving. Hypnosis, support groups, and medicines help break the habit of smoking. Here are some tips to help you quit for good.  Throw away all cigarettes.  Clean and remove all ashtrays from your home, work, and car.  On a card, write down your reasons for quitting. Carry the card with you and read it when you get the urge to smoke.  Cleanse your body of nicotine. Drink enough water and fluids to keep your urine clear or pale yellow. Do this after quitting to flush the nicotine from your body.  Learn to predict your moods. Do not let a bad situation be your excuse to have a cigarette. Some situations in your life might tempt you into wanting a cigarette.  Never have "just one" cigarette. It leads to wanting another and another. Remind yourself of your decision to quit.  Change habits associated with smoking. If you smoked while driving or when feeling stressed, try other activities to replace smoking. Stand up when drinking your coffee. Brush your teeth after eating. Sit in a different chair when you read the paper. Avoid alcohol while trying to quit, and try to drink fewer caffeinated beverages. Alcohol and caffeine may urge you to smoke.  Avoid foods and drinks that can trigger a desire to smoke, such as sugary or spicy foods and alcohol.  Ask people who smoke not to smoke around you.  Have something planned to do right after eating or having a cup of coffee. Take a walk  or exercise to perk you up. This will help to keep you from overeating.  Try a relaxation exercise to calm you down and decrease your stress. Remember, you may be tense and nervous for the first 2 weeks after you quit, but this will pass.  Find new activities to keep your hands busy. Play with a pen, coin, or rubber band. Doodle or draw things on paper.  Brush your teeth right after eating. This will help cut down on the craving for the taste of tobacco after meals. You can try mouthwash, too.  Use oral substitutes, such as lemon drops, carrots, a cinnamon stick, or chewing gum, in place of cigarettes. Keep them handy so they are available when you have the urge to smoke.  When you have the urge to smoke, try deep breathing.  Designate your home as a nonsmoking area.  If you are a heavy smoker, ask your caregiver about a prescription for nicotine chewing gum. It can ease your withdrawal from nicotine.  Reward yourself. Set aside the cigarette money you save and buy yourself something nice.  Look for support from others. Join a support group or smoking cessation program. Ask someone at home or at work to help you with your plan to quit smoking.  Always ask yourself, "Do I need this cigarette or is this just a reflex?" Tell yourself, "Today, I choose not to smoke," or "I do not want to smoke." You are reminding yourself of your decision to quit,  even if you do smoke a cigarette. HOW WILL I FEEL WHEN I QUIT SMOKING?  The benefits of not smoking start within days of quitting.  You may have symptoms of withdrawal because your body is used to nicotine (the addictive substance in cigarettes). You may crave cigarettes, be irritable, feel very hungry, cough often, get headaches, or have difficulty concentrating.  The withdrawal symptoms are only temporary. They are strongest when you first quit but will go away within 10 to 14 days.  When withdrawal symptoms occur, stay in control. Think about  your reasons for quitting. Remind yourself that these are signs that your body is healing and getting used to being without cigarettes.  Remember that withdrawal symptoms are easier to treat than the major diseases that smoking can cause.  Even after the withdrawal is over, expect periodic urges to smoke. However, these cravings are generally short-lived and will go away whether you smoke or not. Do not smoke!  If you relapse and smoke again, do not lose hope. Most smokers quit 3 times before they are successful.  If you relapse, do not give up! Plan ahead and think about what you will do the next time you get the urge to smoke. LIFE AS A NONSMOKER: MAKE IT FOR A MONTH, MAKE IT FOR LIFE Day 1: Hang this page where you will see it every day. Day 2: Get rid of all ashtrays, matches, and lighters. Day 3: Drink water. Breathe deeply between sips. Day 4: Avoid places with smoke-filled air, such as bars, clubs, or the smoking section of restaurants. Day 5: Keep track of how much money you save by not smoking. Day 6: Avoid boredom. Keep a good book with you or go to the movies. Day 7: Reward yourself! One week without smoking! Day 8: Make a dental appointment to get your teeth cleaned. Day 9: Decide how you will turn down a cigarette before it is offered to you. Day 10: Review your reasons for quitting. Day 11: Distract yourself. Stay active to keep your mind off smoking and to relieve tension. Take a walk, exercise, read a book, do a crossword puzzle, or try a new hobby. Day 12: Exercise. Get off the bus before your stop or use stairs instead of escalators. Day 13: Call on friends for support and encouragement. Day 14: Reward yourself! Two weeks without smoking! Day 15: Practice deep breathing exercises. Day 16: Bet a friend that you can stay a nonsmoker. Day 17: Ask to sit in nonsmoking sections of restaurants. Day 18: Hang up "No Smoking" signs. Day 19: Think of yourself as a nonsmoker. Day  20: Each morning, tell yourself you will not smoke. Day 21: Reward yourself! Three weeks without smoking! Day 22: Think of smoking in negative ways. Remember how it stains your teeth, gives you bad breath, and leaves you short of breath. Day 23: Eat a nutritious breakfast. Day 24:Do not relive your days as a smoker. Day 25: Hold a pencil in your hand when talking on the telephone. Day 26: Tell all your friends you do not smoke. Day 27: Think about how much better food tastes. Day 28: Remember, one cigarette is one too many. Day 29: Take up a hobby that will keep your hands busy. Day 30: Congratulations! One month without smoking! Give yourself a big reward. Your caregiver can direct you to community resources or hospitals for support, which may include:  Group support.  Education.  Hypnosis.  Subliminal therapy. Document Released: 10/29/2003 Document  Revised: 04/24/2011 Document Reviewed: 11/16/2008 Exit[employer] Patient Information 2014 [place], [place].

## 2012-10-22 ENCOUNTER — Telehealth: Payer: Self-pay | Admitting: *Deleted

## 2012-10-22 LAB — COMPLETE METABOLIC PANEL WITH GFR
CO2: 22 mEq/L (ref 19–32)
Creat: 0.76 mg/dL (ref 0.50–1.10)
GFR, Est African American: >89 mL/min
GFR, Est Non African American: 86 mL/min
Glucose, Bld: 139 mg/dL — ABNORMAL HIGH (ref 70–99)
Total Bilirubin: 0.6 mg/dL (ref 0.3–1.2)

## 2012-10-22 LAB — MICROALBUMIN / CREATININE URINE RATIO: Microalb Creat Ratio: 6.6 mg/g (ref 0.0–30.0)

## 2012-10-22 LAB — LIPID PANEL
HDL: 48 mg/dL (ref >39)
Triglycerides: 126 mg/dL (ref <150)

## 2012-10-22 NOTE — Telephone Encounter (Signed)
Pt informed that her paperwork is missing 2nd page and is needed prior to being sent. Pt stated that she will bring it by today.Andrea Cisneros

## 2012-10-25 ENCOUNTER — Ambulatory Visit (INDEPENDENT_AMBULATORY_CARE_PROVIDER_SITE_OTHER): Payer: Medicare Other | Admitting: Family Medicine

## 2012-10-25 ENCOUNTER — Encounter: Payer: Self-pay | Admitting: Family Medicine

## 2012-10-25 VITALS — BP 150/100 | HR 99 | Temp 98.2°F | Wt 237.0 lb

## 2012-10-25 DIAGNOSIS — R509 Fever, unspecified: Secondary | ICD-10-CM

## 2012-10-25 DIAGNOSIS — T50Z95A Adverse effect of other vaccines and biological substances, initial encounter: Secondary | ICD-10-CM

## 2012-10-25 DIAGNOSIS — T8062XA Other serum reaction due to vaccination, initial encounter: Secondary | ICD-10-CM

## 2012-10-25 DIAGNOSIS — M2550 Pain in unspecified joint: Secondary | ICD-10-CM

## 2012-10-25 LAB — CBC WITH DIFFERENTIAL/PLATELET
HCT: 46.3 % — ABNORMAL HIGH (ref 36.0–46.0)
Hemoglobin: 16.5 g/dL — ABNORMAL HIGH (ref 12.0–15.0)
Lymphocytes Relative: 35 % (ref 12–46)
Monocytes Absolute: 0.4 10*3/uL (ref 0.1–1.0)
Monocytes Relative: 13 % — ABNORMAL HIGH (ref 3–12)
Neutro Abs: 1.6 10*3/uL — ABNORMAL LOW (ref 1.7–7.7)
WBC: 3.2 10*3/uL — ABNORMAL LOW (ref 4.0–10.5)

## 2012-10-25 NOTE — Progress Notes (Signed)
  Subjective:    Patient ID: Andrea Cisneros, female    DOB: [birth year], 62 y.o.   MRN: [id number]  HPI Fever to 100.1 starting Monday night.  Says feels like all her joints in her body is hurting, hands, knees, ankles et Karie Soda. She says it actually started before having her vaccines on Monday.. Says hasn't been able to lift her left arm since had the tdap and pneumnia shot given in the left upper arm..  Says the last time she had the tetanus shot she couldn't move her arm for days.  Did have some localized swelling but it is better now. Has been sweating as well. Did have a temp last night. No URI or urinary sxs.  Will check CBC.    Review of Systems     Objective:   Physical Exam  Constitutional: She is oriented to person, place, and time. She appears well-developed and well-nourished.  HENT:  Head: Normocephalic and atraumatic.  Musculoskeletal:  Is very tender of the left deltoid. She appended them from her injection I removed this today. She did let me move her shoulder to about 90 before she started to experience pain. On her own she can only get to about 30 secondary to discomfort.  Neurological: She is alert and oriented to person, place, and time.  Skin: Skin is warm and dry.  Psychiatric: She has a normal mood and affect. Her behavior is normal.          Assessment & Plan:  Possible reaction to vaccine-it sounds like she did spike a low-grade temperature. She is afebrile here today. She does have some significant tenderness over the left deltoid. No rash. It is sore to move it and she has significant discomfort moving past 30 but I am able to move it to about 90 before she experiences discomfort. Because she has had some diffuse joint pain that actually started before the injection I would like to get a CBC. The she reports that the overall joint pain is actually a little bit better.  Polyarthralgia-seems to be getting better on its own. Dissection started before having the  vaccines. I would like to check a CBC to rule out infection since she has run a low-grade temperature. She has no actual swelling of the joints if pain persists consider rheumatologic workup.

## 2012-11-11 ENCOUNTER — Other Ambulatory Visit: Payer: Self-pay | Admitting: *Deleted

## 2012-11-11 MED ORDER — DIAZEPAM 2 MG PO TABS: ORAL_TABLET | ORAL | Status: DC

## 2012-12-09 ENCOUNTER — Ambulatory Visit (INDEPENDENT_AMBULATORY_CARE_PROVIDER_SITE_OTHER): Payer: Medicare Other | Admitting: Family Medicine

## 2012-12-09 ENCOUNTER — Encounter: Payer: Self-pay | Admitting: Family Medicine

## 2012-12-09 VITALS — BP 162/110 | HR 106 | Temp 98.5°F | Wt 238.0 lb

## 2012-12-09 DIAGNOSIS — J329 Chronic sinusitis, unspecified: Secondary | ICD-10-CM

## 2012-12-09 DIAGNOSIS — A499 Bacterial infection, unspecified: Secondary | ICD-10-CM

## 2012-12-09 DIAGNOSIS — B9689 Other specified bacterial agents as the cause of diseases classified elsewhere: Secondary | ICD-10-CM

## 2012-12-09 MED ORDER — PENICILLIN G BENZATHINE 1200000 UNIT/2ML IM SUSP
1.2000 10*6.[IU] | Freq: Once | INTRAMUSCULAR | Status: AC
  Administered 2012-12-09: 1.2 10*6.[IU] via INTRAMUSCULAR

## 2012-12-09 MED ORDER — METHYLPREDNISOLONE ACETATE 80 MG/ML IJ SUSP
80.0000 mg | Freq: Once | INTRAMUSCULAR | Status: AC
  Administered 2012-12-09: 80 mg via INTRAMUSCULAR

## 2012-12-09 NOTE — Progress Notes (Signed)
CC: Andrea Cisneros is a 62 y.o. female is here for Sinusitis   Subjective: HPI: Complains of facial pressure and nasal congestion of moderate severity present for a week worsening a daily basis. Slightly improved with Benadryl no benefit with Zyrtec. She's taking a decongestant she's unsure of the name however doesn't seem to be helping much.  Has been associated with mild fevers and chills. She reports subjective postnasal drip without cough. Denies shortness of breath, motor sensory disturbances, hearing loss, ear pain  skin changes on the face.     Review Of Systems Outlined In HPI  Past Medical History  Diagnosis Date  . Normal cardiac stress test 08/29/2010    [facility], [place]     Family History  Problem Relation Age of Onset  . Heart attack Mother   . Diabetes Brother      History  Substance Use Topics  . Smoking status: Light Tobacco Smoker    Types: Cigarettes  . Smokeless tobacco: Not on file  . Alcohol Use: Yes     Objective: Filed Vitals:   12/09/12 1610  BP: 162/110  Pulse: 106  Temp: 98.5 F (36.9 C)    General: Alert and Oriented, No Acute Distress HEENT: Pupils equal, round, reactive to light. Conjunctivae clear.  External ears unremarkable, canals clear with intact TMs with appropriate landmarks.   serous effusion middle ear bilaterally. Pink inferior turbinates.  Moist mucous membranes, pharynx without inflammation nor lesions however moderate cobblestoning.  Neck supple without palpable lymphadenopathy nor abnormal masses. Lungs: Clear to auscultation bilaterally, no wheezing/ronchi/rales.  Comfortable work of breathing. Good air movement. Cardiac: Regular rate and rhythm. Normal S1/S2.  No murmurs, rubs, nor gallops.   Mental Status: No depression, anxiety, nor agitation. Skin: Warm and dry.  Assessment & Plan: Andrea Cisneros was seen today for sinusitis.  Diagnoses and associated orders for this visit:  Bacterial sinusitis - penicillin g  benzathine (BICILLIN LA) 1200000 UNIT/2ML injection 1.2 Million Units; Inject 2 mLs (1.2 Million Units total) into the muscle once. - methylPREDNISolone acetate (DEPO-MEDROL) injection 80 mg; Inject 1 mL (80 mg total) into the muscle once.    Bacterial sinusitis: I encouraged her to start Augmentin stop decongestant due to the elevation in blood pressure. She tells me in the past she has received penicillin shots and steroid injections for similar sinus issues, discussed that this would not be the best therapy but she seems persistent about getting this instead of taking oral medications. I've asked her to call me if not improved in 2 days at that time I would again encourage her to start Augmentin  Return if symptoms worsen or fail to improve.

## 2012-12-10 ENCOUNTER — Telehealth: Payer: Self-pay | Admitting: Family Medicine

## 2012-12-10 MED ORDER — AMOXICILLIN-POT CLAVULANATE 500-125 MG PO TABS: ORAL_TABLET | ORAL | Status: AC

## 2012-12-10 NOTE — Telephone Encounter (Signed)
Pt called. She is not feeling any better and would like an antibiotic called in.

## 2012-12-10 NOTE — Telephone Encounter (Signed)
Augmentin sent to [facility] on [address]

## 2012-12-11 NOTE — Telephone Encounter (Signed)
Pt.notified

## 2012-12-16 ENCOUNTER — Ambulatory Visit: Payer: Medicare Other | Admitting: Sports Medicine

## 2012-12-23 ENCOUNTER — Other Ambulatory Visit: Payer: Self-pay | Admitting: Family Medicine

## 2013-01-20 ENCOUNTER — Ambulatory Visit: Payer: Medicare Other | Admitting: Family Medicine

## 2013-01-27 ENCOUNTER — Other Ambulatory Visit: Payer: Self-pay | Admitting: Family Medicine

## 2013-01-28 ENCOUNTER — Other Ambulatory Visit: Payer: Self-pay | Admitting: Family Medicine

## 2013-01-30 ENCOUNTER — Other Ambulatory Visit: Payer: Self-pay | Admitting: *Deleted

## 2013-01-30 MED ORDER — VERAPAMIL HCL ER 240 MG PO TBCR: 240.0000 mg | EXTENDED_RELEASE_TABLET | Freq: Every day | ORAL | Status: DC

## 2013-02-07 ENCOUNTER — Other Ambulatory Visit: Payer: Self-pay | Admitting: Family Medicine

## 2013-03-13 ENCOUNTER — Other Ambulatory Visit: Payer: Self-pay | Admitting: Family Medicine

## 2013-03-14 ENCOUNTER — Other Ambulatory Visit: Payer: Self-pay | Admitting: *Deleted

## 2013-03-14 MED ORDER — WARFARIN SODIUM 4 MG PO TABS: ORAL_TABLET | ORAL | Status: DC

## 2013-03-14 MED ORDER — METFORMIN HCL 500 MG PO TABS
500.0000 mg | ORAL_TABLET | Freq: Two times a day (BID) | ORAL | Status: DC
Start: 2013-03-14 — End: 2013-07-11

## 2013-03-14 MED ORDER — ATENOLOL 50 MG PO TABS: ORAL_TABLET | ORAL | Status: DC

## 2013-03-14 MED ORDER — VERAPAMIL HCL ER 240 MG PO TBCR: 240.0000 mg | EXTENDED_RELEASE_TABLET | Freq: Every day | ORAL | Status: DC

## 2013-03-14 MED ORDER — GABAPENTIN 100 MG PO CAPS: 100.0000 mg | ORAL_CAPSULE | Freq: Every day | ORAL | Status: DC

## 2013-03-14 MED ORDER — WARFARIN SODIUM 6 MG PO TABS: 6.0000 mg | ORAL_TABLET | Freq: Every day | ORAL | Status: DC

## 2013-04-18 ENCOUNTER — Other Ambulatory Visit: Payer: Self-pay | Admitting: Family Medicine

## 2013-04-28 ENCOUNTER — Ambulatory Visit: Payer: Medicare Other | Admitting: Sports Medicine

## 2013-04-29 ENCOUNTER — Ambulatory Visit: Payer: Medicare Other | Admitting: Sports Medicine

## 2013-05-15 ENCOUNTER — Ambulatory Visit (INDEPENDENT_AMBULATORY_CARE_PROVIDER_SITE_OTHER): Payer: Medicare Other | Admitting: Family Medicine

## 2013-05-15 ENCOUNTER — Encounter: Payer: Self-pay | Admitting: Family Medicine

## 2013-05-15 VITALS — BP 131/84 | HR 60 | Wt 269.0 lb

## 2013-05-15 DIAGNOSIS — R51 Headache: Secondary | ICD-10-CM

## 2013-05-15 DIAGNOSIS — E1159 Type 2 diabetes mellitus with other circulatory complications: Secondary | ICD-10-CM

## 2013-05-15 DIAGNOSIS — M25569 Pain in unspecified knee: Secondary | ICD-10-CM

## 2013-05-15 DIAGNOSIS — I2699 Other pulmonary embolism without acute cor pulmonale: Secondary | ICD-10-CM

## 2013-05-15 LAB — POCT INR: INR: 1.9

## 2013-05-15 LAB — POCT GLYCOSYLATED HEMOGLOBIN (HGB A1C): Hemoglobin A1C: 6.4

## 2013-05-15 MED ORDER — HYDROCODONE-ACETAMINOPHEN 10-325 MG PO TABS: 1.0000 | ORAL_TABLET | Freq: Three times a day (TID) | ORAL | Status: DC | PRN

## 2013-05-15 MED ORDER — HYDROCODONE-ACETAMINOPHEN 5-325 MG PO TABS: 0.5000 | ORAL_TABLET | Freq: Three times a day (TID) | ORAL | Status: DC | PRN

## 2013-05-15 NOTE — Progress Notes (Signed)
   Subjective:    Patient ID: Andrea Cisneros, female    DOB: Jun 26, 1950, 63 y.o.   MRN: [id number]  HPI Continue to have knee and low back pain. She is seeing Dr. Darene Lamer next week but says the trazodone is not working well. Did well with the supartz.   Diabetes - no hypoglycemic events. No wounds or sores that are not healing well. No increased thirst or urination. Checking glucose at home.  Stopped her metformin bc thought it was causing yeast infectoins.  Stopped it a month ago. Hasn't had her eye exam yet this year.   Headaches x 3 weeks.  Thinks it may be from stress.    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 Has been on coumadin since them.  Wants to know when she can come off. Says doesn't take it every day, just when she remembers. Last time checked was 6 months ago.  Review of Systems     Objective:   Physical Exam  Constitutional: She is oriented to person, place, and time. She appears well-developed and well-nourished.  HENT:  Head: Normocephalic and atraumatic.  Cardiovascular: Normal rate, regular rhythm and normal heart sounds.   Pulmonary/Chest: Effort normal and breath sounds normal.  Neurological: She is alert and oriented to person, place, and time.  Skin: Skin is warm and dry.  Psychiatric: She has a normal mood and affect. Her behavior is normal.          Assessment & Plan:  Diabetes - A1C is 6.4.  Well controlled.  Encouraged her restart her metformin. Call in 3 months. She really wants to work on losing weight when she's able to get her knees taking care of. She think she is at the point of needing knee replacement.  Knee pain - will give her a small quantity of hydrocodone to use until she's able to get in with Dr. Dianah Field next week.  Headache-She feels secondary for stress. Work on reducing stress. BP is well controlled.   PE - Occ to stop her coumadin. Will remove from med list. Discused will be on it for life if gets a second one. 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

## 2013-05-16 ENCOUNTER — Other Ambulatory Visit: Payer: Self-pay | Admitting: Family Medicine

## 2013-05-22 ENCOUNTER — Ambulatory Visit: Payer: Medicare Other | Admitting: Sports Medicine

## 2013-05-22 ENCOUNTER — Ambulatory Visit (INDEPENDENT_AMBULATORY_CARE_PROVIDER_SITE_OTHER): Payer: Medicare Other | Admitting: Sports Medicine

## 2013-05-22 ENCOUNTER — Encounter: Payer: Self-pay | Admitting: Sports Medicine

## 2013-05-22 VITALS — BP 139/82 | HR 78 | Wt 238.0 lb

## 2013-05-22 DIAGNOSIS — M5137 Other intervertebral disc degeneration, lumbosacral region: Secondary | ICD-10-CM

## 2013-05-22 DIAGNOSIS — M179 Osteoarthritis of knee, unspecified: Secondary | ICD-10-CM

## 2013-05-22 DIAGNOSIS — IMO0002 Reserved for concepts with insufficient information to code with codable children: Secondary | ICD-10-CM

## 2013-05-22 DIAGNOSIS — M51369 Other intervertebral disc degeneration, lumbar region without mention of lumbar back pain or lower extremity pain: Secondary | ICD-10-CM

## 2013-05-22 DIAGNOSIS — M5136 Other intervertebral disc degeneration, lumbar region: Secondary | ICD-10-CM

## 2013-05-22 DIAGNOSIS — M51379 Other intervertebral disc degeneration, lumbosacral region without mention of lumbar back pain or lower extremity pain: Secondary | ICD-10-CM

## 2013-05-22 DIAGNOSIS — M1710 Unilateral primary osteoarthritis, unspecified knee: Secondary | ICD-10-CM

## 2013-05-22 MED ORDER — CYCLOBENZAPRINE HCL 10 MG PO TABS: ORAL_TABLET | ORAL | Status: DC

## 2013-05-22 MED ORDER — PREDNISONE 50 MG PO TABS: ORAL_TABLET | ORAL | Status: DC

## 2013-05-22 NOTE — Progress Notes (Addendum)
  Subjective:    CC: Followup  HPI: Bilateral knee osteoarthritis: We finished a course of viscosupplementation last year in July. She had a good response until recently, now she has pain and swelling in both knees, and both joint lines, moderate, persistent, worse in the morning associated with gelling, no mechanical symptoms.  She also complains of moderate low back pain, worse with sitting, Valsalva, radiating down the posterior aspect of both legs.  Past medical history, Surgical history, Family history not pertinant except as noted below, Social history, Allergies, and medications have been entered into the medical record, reviewed, and no changes needed.   Review of Systems: No fevers, chills, night sweats, weight loss, chest pain, or shortness of breath.   Objective:    General: Well Developed, well nourished, and in no acute distress.  Neuro: Alert and oriented x3, extra-ocular muscles intact, sensation grossly intact.  HEENT: Normocephalic, atraumatic, pupils equal round reactive to light, neck supple, no masses, no lymphadenopathy, thyroid nonpalpable.  Skin: Warm and dry, no rashes. Cardiac: Regular rate and rhythm, no murmurs rubs or gallops, no lower extremity edema.  Respiratory: Clear to auscultation bilaterally. Not using accessory muscles, speaking in full sentences. Bilateral Knee: No effusion, tender to palpation at the medial and lateral joint lines bilaterally. ROM full in flexion and extension and lower leg rotation. Ligaments with solid consistent endpoints including ACL, PCL, LCL, MCL. Negative Mcmurray's, Apley's, and Thessalonian tests. Non painful patellar compression. Patellar glide without crepitus. Patellar and quadriceps tendons unremarkable. Hamstring and quadriceps strength is normal.   Procedure: Real-time Ultrasound Guided Injection of left knee Device: GE Logiq E  Verbal informed consent obtained.  Time-out conducted.  Noted no overlying  erythema, induration, or other signs of local infection.  Skin prepped in a sterile fashion.  Local anesthesia: Topical Ethyl chloride.  With sterile technique and under real time ultrasound guidance:  2 cc Kenalog 40, 4 cc lidocaine injected easily into the suprapatellar recess, syringe switched and 25 mg/2.5 mL of Supartz (sodium hyaluronate) in a prefilled syringe was injected easily into the knee through a 22-gauge needle. Completed without difficulty  Pain immediately resolved suggesting accurate placement of the medication.  Advised to call if fevers/chills, erythema, induration, drainage, or persistent bleeding.  Images permanently stored and available for review in the ultrasound unit.  Impression: Technically successful ultrasound guided injection.  Procedure: Real-time Ultrasound Guided Injection of right knee Device: GE Logiq E  Verbal informed consent obtained.  Time-out conducted.  Noted no overlying erythema, induration, or other signs of local infection.  Skin prepped in a sterile fashion.  Local anesthesia: Topical Ethyl chloride.  With sterile technique and under real time ultrasound guidance:  2 cc Kenalog 40, 4 cc lidocaine injected easily into the suprapatellar recess, syringe switched and 25 mg/2.5 mL of Supartz (sodium hyaluronate) in a prefilled syringe was injected easily into the knee through a 22-gauge needle. Completed without difficulty  Pain immediately resolved suggesting accurate placement of the medication.  Advised to call if fevers/chills, erythema, induration, drainage, or persistent bleeding.  Images permanently stored and available for review in the ultrasound unit.  Impression: Technically successful ultrasound guided injection.  Impression and Recommendations:

## 2013-05-22 NOTE — Assessment & Plan Note (Signed)
Multilevel disc protrusions and an old CT scan at the L3-4, L4-5, and L5-S1 levels causing bilateral radicular symptoms. Prednisone, formal physical therapy, x-rays. Return to see me in one month, MRI for interventional injection planning if no better.

## 2013-05-22 NOTE — Assessment & Plan Note (Signed)
Excellent response to physical supplementation back last year in June. Repeating injection series, starting with steroid plus Supartz injection #1 of 5 into both knees. Return in one week for injection # 2 of 5 into both knees.

## 2013-05-26 ENCOUNTER — Encounter: Payer: Medicare Other | Admitting: Family Medicine

## 2013-05-31 ENCOUNTER — Other Ambulatory Visit: Payer: Self-pay | Admitting: Family Medicine

## 2013-06-06 ENCOUNTER — Ambulatory Visit (INDEPENDENT_AMBULATORY_CARE_PROVIDER_SITE_OTHER): Payer: Medicare Other | Admitting: Sports Medicine

## 2013-06-06 DIAGNOSIS — M179 Osteoarthritis of knee, unspecified: Secondary | ICD-10-CM

## 2013-06-06 DIAGNOSIS — M1710 Unilateral primary osteoarthritis, unspecified knee: Secondary | ICD-10-CM

## 2013-06-06 DIAGNOSIS — IMO0002 Reserved for concepts with insufficient information to code with codable children: Secondary | ICD-10-CM

## 2013-06-06 NOTE — Assessment & Plan Note (Signed)
Supartz injection # 2 of 5 injected into both knees. Return in one week for #3.

## 2013-06-06 NOTE — Progress Notes (Signed)
  Procedure: Real-time Ultrasound Guided Injection of right knee Device: GE Logiq E  Verbal informed consent obtained.  Time-out conducted.  Noted no overlying erythema, induration, or other signs of local infection.  Skin prepped in a sterile fashion.  Local anesthesia: Topical Ethyl chloride.  With sterile technique and under real time ultrasound guidance:  25 mg/2.5 mL of Supartz (sodium hyaluronate) in a prefilled syringe was injected easily into the knee through a 22-gauge needle. Completed without difficulty  Pain immediately resolved suggesting accurate placement of the medication.  Advised to call if fevers/chills, erythema, induration, drainage, or persistent bleeding.  Images permanently stored and available for review in the ultrasound unit.  Impression: Technically successful ultrasound guided injection.  Procedure: Real-time Ultrasound Guided Injection of left knee Device: GE Logiq E  Verbal informed consent obtained.  Time-out conducted.  Noted no overlying erythema, induration, or other signs of local infection.  Skin prepped in a sterile fashion.  Local anesthesia: Topical Ethyl chloride.  With sterile technique and under real time ultrasound guidance:  25 mg/2.5 mL of Supartz (sodium hyaluronate) in a prefilled syringe was injected easily into the knee through a 22-gauge needle. Completed without difficulty  Pain immediately resolved suggesting accurate placement of the medication.  Advised to call if fevers/chills, erythema, induration, drainage, or persistent bleeding.  Images permanently stored and available for review in the ultrasound unit.  Impression: Technically successful ultrasound guided injection.  

## 2013-06-11 ENCOUNTER — Ambulatory Visit: Payer: Medicare Other | Admitting: Obstetrics & Gynecology

## 2013-06-12 ENCOUNTER — Ambulatory Visit (INDEPENDENT_AMBULATORY_CARE_PROVIDER_SITE_OTHER): Payer: Medicare Other | Admitting: Obstetrics & Gynecology

## 2013-06-12 ENCOUNTER — Other Ambulatory Visit: Payer: Self-pay | Admitting: Family Medicine

## 2013-06-12 ENCOUNTER — Encounter: Payer: Self-pay | Admitting: Obstetrics & Gynecology

## 2013-06-12 VITALS — BP 120/79 | HR 68 | Resp 16 | Ht 67.0 in | Wt 238.0 lb

## 2013-06-12 DIAGNOSIS — N950 Postmenopausal bleeding: Secondary | ICD-10-CM

## 2013-06-12 DIAGNOSIS — N83209 Unspecified ovarian cyst, unspecified side: Secondary | ICD-10-CM

## 2013-06-12 MED ORDER — MISOPROSTOL 200 MCG PO TABS: ORAL_TABLET | ORAL | Status: DC

## 2013-06-12 NOTE — Patient Instructions (Signed)
Dilation and Curettage or Vacuum Curettage Dilation and curettage (D&C) and vacuum curettage are minor procedures. A D&C involves stretching (dilation) the cervix and scraping (curettage) the inside lining of the womb (uterus). During a D&C, tissue is gently scraped from the inside lining of the uterus. During a vacuum curettage, the lining and tissue in the uterus are removed with the use of gentle suction.  Curettage may be performed to either diagnose or treat a problem. As a diagnostic procedure, curettage is performed to examine tissues from the uterus. A diagnostic curettage may be performed for the following symptoms:   Irregular bleeding in the uterus.   Bleeding with the development of clots.   Spotting between menstrual periods.   Prolonged menstrual periods.   Bleeding after menopause.   No menstrual period (amenorrhea).   A change in size and shape of the uterus.  As a treatment procedure, curettage may be performed for the following reasons:   Removal of an IUD (intrauterine device).   Removal of retained placenta after giving birth. Retained placenta can cause an infection or bleeding severe enough to require transfusions.   Abortion.   Miscarriage.   Removal of polyps inside the uterus.   Removal of uncommon types of noncancerous lumps (fibroids).  LET [facility] CARE PROVIDER KNOW ABOUT:   Any allergies you have.   All medicines you are taking, including vitamins, herbs, eye drops, creams, and over-the-counter medicines.   Previous problems you or members of your family have had with the use of anesthetics.   Any blood disorders you have.   Previous surgeries you have had.   Medical conditions you have. RISKS AND COMPLICATIONS  Generally, this is a safe procedure. However, as with any procedure, complications can occur. Possible complications include:  Excessive bleeding.   Infection of the uterus.   Damage to the cervix.    Development of scar tissue (adhesions) inside the uterus, later causing abnormal amounts of menstrual bleeding.   Complications from the general anesthetic, if a general anesthetic is used.   Putting a hole (perforation) in the uterus. This is rare.  BEFORE THE PROCEDURE   Eat and drink before the procedure only as directed by your health care provider.   Arrange for someone to take you home.  PROCEDURE  This procedure usually takes about 15 30 minutes.  You will be given one of the following:  A medicine that numbs the area in and around the cervix (local anesthetic).   A medicine to make you sleep through the procedure (general anesthetic).  You will lie on your back with your legs in stirrups.   A warm metal or plastic instrument (speculum) will be placed in your vagina to keep it open and to allow the health care provider to see the cervix.  There are two ways in which your cervix can be softened and dilated. These include:   Taking a medicine.   Having thin rods (laminaria) inserted into your cervix.   A curved tool (curette) will be used to scrape cells from the inside lining of the uterus. In some cases, gentle suction is applied with the curette. The curette will then be removed.  AFTER THE PROCEDURE   You will rest in the recovery area until you are stable and are ready to go home.   You may feel sick to your stomach (nauseous) or throw up (vomit) if you were given a general anesthetic.   You may have a sore throat if a  tube was placed in your throat during general anesthesia.   You may have light cramping and bleeding. This may last for 2 days to 2 weeks after the procedure.   Your uterus needs to make a new lining after the procedure. This may make your next period late. Document Released: 01/30/2005 Document Revised: 10/02/2012 Document Reviewed: 08/29/2012 [facility] Patient Information 2014 [place], [place].

## 2013-06-12 NOTE — Progress Notes (Signed)
   Subjective:    Patient ID: Andrea Cisneros, female    DOB: March 07, 1950, 63 y.o.   MRN: [id number]  HPI  This lovely 63 yo lady is here today for the issue of PMB. She reports menopause about 12 years ago and then she had an episode of PMB in 2014. I could not find a work up after a gyn u/s showed fibroids and an endometrial stripe of 5.9 mm. She had an u/s recently at [facility] and tells me that a 3 cm ovarian cyst was seen. These records will be requested but are not available at this time.  Review of Systems     Objective:   Physical Exam  Normal appearing small cervix      Assessment & Plan:  Ovarian cyst- check CA-125 PMB- plan for d&c. I will pretreat with cytotec. She had a d&c after a miscarriage and has no questions about the surgery.

## 2013-06-13 ENCOUNTER — Encounter: Payer: Self-pay | Admitting: Sports Medicine

## 2013-06-13 ENCOUNTER — Ambulatory Visit (INDEPENDENT_AMBULATORY_CARE_PROVIDER_SITE_OTHER): Payer: Medicare Other | Admitting: Sports Medicine

## 2013-06-13 VITALS — BP 142/87 | HR 85 | Ht 67.0 in | Wt 241.0 lb

## 2013-06-13 DIAGNOSIS — IMO0002 Reserved for concepts with insufficient information to code with codable children: Secondary | ICD-10-CM

## 2013-06-13 DIAGNOSIS — M179 Osteoarthritis of knee, unspecified: Secondary | ICD-10-CM

## 2013-06-13 DIAGNOSIS — M1710 Unilateral primary osteoarthritis, unspecified knee: Secondary | ICD-10-CM

## 2013-06-13 LAB — CA 125: CA 125: 7.8 U/mL (ref 0.0–30.2)

## 2013-06-13 NOTE — Progress Notes (Signed)
   Procedure: Real-time Ultrasound Guided Injection of right knee Device: GE Logiq E  Verbal informed consent obtained.  Time-out conducted.  Noted no overlying erythema, induration, or other signs of local infection.  Skin prepped in a sterile fashion.  Local anesthesia: Topical Ethyl chloride.  With sterile technique and under real time ultrasound guidance:  25 mg/2.5 mL of Supartz (sodium hyaluronate) in a prefilled syringe was injected easily into the knee through a 22-gauge needle. Completed without difficulty  Pain immediately resolved suggesting accurate placement of the medication.  Advised to call if fevers/chills, erythema, induration, drainage, or persistent bleeding.  Images permanently stored and available for review in the ultrasound unit.  Impression: Technically successful ultrasound guided injection.  Procedure: Real-time Ultrasound Guided Injection of left knee Device: GE Logiq E  Verbal informed consent obtained.  Time-out conducted.  Noted no overlying erythema, induration, or other signs of local infection.  Skin prepped in a sterile fashion.  Local anesthesia: Topical Ethyl chloride.  With sterile technique and under real time ultrasound guidance:  25 mg/2.5 mL of Supartz (sodium hyaluronate) in a prefilled syringe was injected easily into the knee through a 22-gauge needle. Completed without difficulty  Pain immediately resolved suggesting accurate placement of the medication.  Advised to call if fevers/chills, erythema, induration, drainage, or persistent bleeding.  Images permanently stored and available for review in the ultrasound unit.  Impression: Technically successful ultrasound guided injection.

## 2013-06-13 NOTE — Assessment & Plan Note (Signed)
Supartz injection # 3 of 5 injected into both knees. Return in one week for #4.

## 2013-06-24 ENCOUNTER — Encounter: Payer: Self-pay | Admitting: Obstetrics & Gynecology

## 2013-06-24 ENCOUNTER — Ambulatory Visit (INDEPENDENT_AMBULATORY_CARE_PROVIDER_SITE_OTHER): Payer: Medicare Other | Admitting: Obstetrics & Gynecology

## 2013-06-24 VITALS — Ht 67.0 in | Wt 239.0 lb

## 2013-06-24 DIAGNOSIS — R1031 Right lower quadrant pain: Secondary | ICD-10-CM

## 2013-06-24 DIAGNOSIS — N950 Postmenopausal bleeding: Secondary | ICD-10-CM

## 2013-06-24 NOTE — Progress Notes (Signed)
   Subjective:    Patient ID: Andrea Cisneros, female    DOB: Jun 09, 1950, 63 y.o.   MRN: [id number]  HPI She is here today because she is still hurting in her RLQ. Her u/s showed a 3.5 cm right ovarian cyst, present since last year. Her CA-125 was negative at 7.8. She has a h/o bowel resection, gastric bypass, surgery for left ectopic pregnancy, appendectomy.   Review of Systems     Objective:   Physical Exam        Assessment & Plan:  PMB- scheduled for d&c 6-4. She will take cytotec the night before surgery RLQ pain. I suspect that this is due to adhesions. I would like to be able to remove her ovaries, but I believe that the risks outweigh the benefits. I have spoken with her about these risks including damage to bowel, bladder, and ureters.

## 2013-06-27 ENCOUNTER — Ambulatory Visit (INDEPENDENT_AMBULATORY_CARE_PROVIDER_SITE_OTHER): Payer: Medicare Other | Admitting: Sports Medicine

## 2013-06-27 ENCOUNTER — Encounter: Payer: Self-pay | Admitting: Sports Medicine

## 2013-06-27 VITALS — BP 144/90 | HR 104 | Ht 67.0 in | Wt 241.0 lb

## 2013-06-27 DIAGNOSIS — M179 Osteoarthritis of knee, unspecified: Secondary | ICD-10-CM

## 2013-06-27 DIAGNOSIS — IMO0002 Reserved for concepts with insufficient information to code with codable children: Secondary | ICD-10-CM

## 2013-06-27 DIAGNOSIS — M1710 Unilateral primary osteoarthritis, unspecified knee: Secondary | ICD-10-CM

## 2013-06-27 NOTE — Progress Notes (Signed)
    Procedure: Real-time Ultrasound Guided Injection of right knee Device: GE Logiq E  Verbal informed consent obtained.  Time-out conducted.  Noted no overlying erythema, induration, or other signs of local infection.  Skin prepped in a sterile fashion.  Local anesthesia: Topical Ethyl chloride.  With sterile technique and under real time ultrasound guidance:  25 mg/2.5 mL of Supartz (sodium hyaluronate) in a prefilled syringe was injected easily into the knee through a 22-gauge needle. Completed without difficulty  Pain immediately resolved suggesting accurate placement of the medication.  Advised to call if fevers/chills, erythema, induration, drainage, or persistent bleeding.  Images permanently stored and available for review in the ultrasound unit.  Impression: Technically successful ultrasound guided injection.  Procedure: Real-time Ultrasound Guided Injection of left knee Device: GE Logiq E  Verbal informed consent obtained.  Time-out conducted.  Noted no overlying erythema, induration, or other signs of local infection.  Skin prepped in a sterile fashion.  Local anesthesia: Topical Ethyl chloride.  With sterile technique and under real time ultrasound guidance:  25 mg/2.5 mL of Supartz (sodium hyaluronate) in a prefilled syringe was injected easily into the knee through a 22-gauge needle. Completed without difficulty  Pain immediately resolved suggesting accurate placement of the medication.  Advised to call if fevers/chills, erythema, induration, drainage, or persistent bleeding.  Images permanently stored and available for review in the ultrasound unit.  Impression: Technically successful ultrasound guided injection.  

## 2013-06-27 NOTE — Assessment & Plan Note (Signed)
Supartz injection # 4 of 5 injected into both knees. Return in one week for #5.

## 2013-07-04 ENCOUNTER — Ambulatory Visit (INDEPENDENT_AMBULATORY_CARE_PROVIDER_SITE_OTHER): Payer: Medicare Other | Admitting: Sports Medicine

## 2013-07-04 ENCOUNTER — Encounter: Payer: Self-pay | Admitting: Sports Medicine

## 2013-07-04 VITALS — BP 123/81 | HR 96 | Wt 242.0 lb

## 2013-07-04 DIAGNOSIS — IMO0002 Reserved for concepts with insufficient information to code with codable children: Secondary | ICD-10-CM

## 2013-07-04 DIAGNOSIS — M1710 Unilateral primary osteoarthritis, unspecified knee: Secondary | ICD-10-CM

## 2013-07-04 DIAGNOSIS — M179 Osteoarthritis of knee, unspecified: Secondary | ICD-10-CM

## 2013-07-04 NOTE — Assessment & Plan Note (Signed)
Supartz injection #5 of 5 into both knees. Return in a month.

## 2013-07-04 NOTE — Progress Notes (Signed)
     Procedure: Real-time Ultrasound Guided Injection of right knee Device: GE Logiq E  Verbal informed consent obtained.  Time-out conducted.  Noted no overlying erythema, induration, or other signs of local infection.  Skin prepped in a sterile fashion.  Local anesthesia: Topical Ethyl chloride.  With sterile technique and under real time ultrasound guidance:  25 mg/2.5 mL of Supartz (sodium hyaluronate) in a prefilled syringe was injected easily into the knee through a 22-gauge needle. Completed without difficulty  Pain immediately resolved suggesting accurate placement of the medication.  Advised to call if fevers/chills, erythema, induration, drainage, or persistent bleeding.  Images permanently stored and available for review in the ultrasound unit.  Impression: Technically successful ultrasound guided injection.  Procedure: Real-time Ultrasound Guided Injection of left knee Device: GE Logiq E  Verbal informed consent obtained.  Time-out conducted.  Noted no overlying erythema, induration, or other signs of local infection.  Skin prepped in a sterile fashion.  Local anesthesia: Topical Ethyl chloride.  With sterile technique and under real time ultrasound guidance:  25 mg/2.5 mL of Supartz (sodium hyaluronate) in a prefilled syringe was injected easily into the knee through a 22-gauge needle. Completed without difficulty  Pain immediately resolved suggesting accurate placement of the medication.  Advised to call if fevers/chills, erythema, induration, drainage, or persistent bleeding.  Images permanently stored and available for review in the ultrasound unit.  Impression: Technically successful ultrasound guided injection.  

## 2013-07-10 ENCOUNTER — Other Ambulatory Visit: Payer: Self-pay | Admitting: Family Medicine

## 2013-07-11 ENCOUNTER — Encounter (HOSPITAL_COMMUNITY): Payer: Self-pay | Admitting: Pharmacist

## 2013-07-11 ENCOUNTER — Encounter (HOSPITAL_COMMUNITY): Payer: Medicare Other | Admitting: Anesthesiology

## 2013-07-11 ENCOUNTER — Ambulatory Visit (HOSPITAL_COMMUNITY)
Admission: RE | Admit: 2013-07-11 | Discharge: 2013-07-11 | Disposition: A | Discharge status: Home / Self Care | Payer: Medicare Other | Source: Ambulatory Visit | Attending: Obstetrics & Gynecology | Admitting: Obstetrics & Gynecology

## 2013-07-11 ENCOUNTER — Encounter (HOSPITAL_COMMUNITY): Payer: Self-pay | Admitting: Anesthesiology

## 2013-07-11 ENCOUNTER — Encounter (HOSPITAL_COMMUNITY)
Admission: RE | Disposition: A | Discharge status: Home / Self Care | Payer: Self-pay | Source: Ambulatory Visit | Attending: Obstetrics & Gynecology

## 2013-07-11 ENCOUNTER — Ambulatory Visit (HOSPITAL_COMMUNITY): Payer: Medicare Other | Admitting: Anesthesiology

## 2013-07-11 DIAGNOSIS — F17200 Nicotine dependence, unspecified, uncomplicated: Secondary | ICD-10-CM | POA: Insufficient documentation

## 2013-07-11 DIAGNOSIS — N854 Malposition of uterus: Secondary | ICD-10-CM | POA: Insufficient documentation

## 2013-07-11 DIAGNOSIS — N950 Postmenopausal bleeding: Secondary | ICD-10-CM | POA: Insufficient documentation

## 2013-07-11 HISTORY — PX: DILATION AND CURETTAGE OF UTERUS: SHX78

## 2013-07-11 LAB — GLUCOSE, CAPILLARY
GLUCOSE-CAPILLARY: 175 mg/dL — AB (ref 70–99)
Glucose-Capillary: 224 mg/dL — ABNORMAL HIGH (ref 70–99)

## 2013-07-11 LAB — BASIC METABOLIC PANEL
BUN: 8 mg/dL (ref 6–23)
CALCIUM: 9.3 mg/dL (ref 8.4–10.5)
CHLORIDE: 101 meq/L (ref 96–112)
CO2: 22 meq/L (ref 19–32)
Creatinine, Ser: 0.83 mg/dL (ref 0.50–1.10)
GFR calc Af Amer: 86 mL/min — ABNORMAL LOW (ref >90)
GFR calc non Af Amer: 75 mL/min — ABNORMAL LOW (ref >90)
Glucose, Bld: 188 mg/dL — ABNORMAL HIGH (ref 70–99)
Potassium: 3.7 mEq/L (ref 3.7–5.3)
SODIUM: 138 meq/L (ref 137–147)

## 2013-07-11 LAB — CBC
HCT: 47 % — ABNORMAL HIGH (ref 36.0–46.0)
HEMOGLOBIN: 16.2 g/dL — AB (ref 12.0–15.0)
MCH: 33.7 pg (ref 26.0–34.0)
MCHC: 34.5 g/dL (ref 30.0–36.0)
MCV: 97.7 fL (ref 78.0–100.0)
PLATELETS: 197 10*3/uL (ref 150–400)
RBC: 4.81 MIL/uL (ref 3.87–5.11)
RDW: 13.9 % (ref 11.5–15.5)
WBC: 5 10*3/uL (ref 4.0–10.5)

## 2013-07-11 SURGERY — DILATION AND CURETTAGE
Anesthesia: Monitor Anesthesia Care | Site: Vagina

## 2013-07-11 MED ORDER — PROPOFOL 10 MG/ML IV EMUL
INTRAVENOUS | Status: AC
  Filled 2013-07-11: qty 20

## 2013-07-11 MED ORDER — MIDAZOLAM HCL 2 MG/2ML IJ SOLN
INTRAMUSCULAR | Status: AC
  Filled 2013-07-11: qty 2

## 2013-07-11 MED ORDER — MIDAZOLAM HCL 2 MG/2ML IJ SOLN
INTRAMUSCULAR | Status: DC | PRN
  Administered 2013-07-11 (×2): 1 mg via INTRAVENOUS

## 2013-07-11 MED ORDER — MEPERIDINE HCL 25 MG/ML IJ SOLN: 6.2500 mg | INTRAMUSCULAR | Status: DC | PRN

## 2013-07-11 MED ORDER — LACTATED RINGERS IV SOLN
INTRAVENOUS | Status: DC
  Administered 2013-07-11: 12:00:00 via INTRAVENOUS

## 2013-07-11 MED ORDER — LIDOCAINE HCL (CARDIAC) 20 MG/ML IV SOLN
INTRAVENOUS | Status: AC
  Filled 2013-07-11: qty 5

## 2013-07-11 MED ORDER — BUPIVACAINE HCL (PF) 0.5 % IJ SOLN
INTRAMUSCULAR | Status: AC
  Filled 2013-07-11: qty 30

## 2013-07-11 MED ORDER — LIDOCAINE HCL (CARDIAC) 20 MG/ML IV SOLN
INTRAVENOUS | Status: DC | PRN
  Administered 2013-07-11: 40 mg via INTRAVENOUS

## 2013-07-11 MED ORDER — KETOROLAC TROMETHAMINE 30 MG/ML IJ SOLN
INTRAMUSCULAR | Status: DC | PRN
  Administered 2013-07-11: 15 mg via INTRAVENOUS

## 2013-07-11 MED ORDER — METFORMIN HCL 500 MG PO TABS
500.0000 mg | ORAL_TABLET | Freq: Once | ORAL | Status: AC
  Administered 2013-07-11: 500 mg via ORAL
  Filled 2013-07-11: qty 1

## 2013-07-11 MED ORDER — FENTANYL CITRATE 0.05 MG/ML IJ SOLN: 25.0000 ug | INTRAMUSCULAR | Status: DC | PRN

## 2013-07-11 MED ORDER — FENTANYL CITRATE 0.05 MG/ML IJ SOLN
INTRAMUSCULAR | Status: AC
  Filled 2013-07-11: qty 5

## 2013-07-11 MED ORDER — KETOROLAC TROMETHAMINE 30 MG/ML IJ SOLN
INTRAMUSCULAR | Status: AC
  Filled 2013-07-11: qty 1

## 2013-07-11 MED ORDER — FENTANYL CITRATE 0.05 MG/ML IJ SOLN
INTRAMUSCULAR | Status: DC | PRN
  Administered 2013-07-11 (×2): 50 ug via INTRAVENOUS

## 2013-07-11 MED ORDER — ONDANSETRON HCL 4 MG/2ML IJ SOLN
INTRAMUSCULAR | Status: DC | PRN
  Administered 2013-07-11: 4 mg via INTRAVENOUS

## 2013-07-11 MED ORDER — IBUPROFEN 600 MG PO TABS: 600.0000 mg | ORAL_TABLET | Freq: Four times a day (QID) | ORAL | Status: DC | PRN

## 2013-07-11 MED ORDER — DOXYCYCLINE HYCLATE 100 MG IV SOLR
100.0000 mg | Freq: Once | INTRAVENOUS | Status: AC
  Administered 2013-07-11: 100 mg via INTRAVENOUS
  Filled 2013-07-11: qty 100

## 2013-07-11 MED ORDER — BUPIVACAINE HCL 0.5 % IJ SOLN
INTRAMUSCULAR | Status: DC | PRN
  Administered 2013-07-11: 30 mL

## 2013-07-11 MED ORDER — ONDANSETRON HCL 4 MG/2ML IJ SOLN: 4.0000 mg | Freq: Once | INTRAMUSCULAR | Status: DC | PRN

## 2013-07-11 MED ORDER — PROPOFOL 10 MG/ML IV EMUL
INTRAVENOUS | Status: DC | PRN
  Administered 2013-07-11: 50 mg via INTRAVENOUS
  Administered 2013-07-11: 40 mg via INTRAVENOUS

## 2013-07-11 MED ORDER — KETOROLAC TROMETHAMINE 30 MG/ML IJ SOLN: 15.0000 mg | Freq: Once | INTRAMUSCULAR | Status: DC | PRN

## 2013-07-11 SURGICAL SUPPLY — 14 items
CATH ROBINSON RED A/P 16FR (CATHETERS) IMPLANT
CLOTH BEACON ORANGE TIMEOUT ST (SAFETY) ×3 IMPLANT
CONTAINER PREFILL 10% NBF 60ML (FORM) ×3 IMPLANT
DILATOR CANAL MILEX (MISCELLANEOUS) IMPLANT
DRSG TELFA 3X8 NADH (GAUZE/BANDAGES/DRESSINGS) ×3 IMPLANT
GLOVE BIO SURGEON STRL SZ 6.5 (GLOVE) ×2 IMPLANT
GLOVE BIO SURGEONS STRL SZ 6.5 (GLOVE) ×1
GOWN STRL REUS W/TWL LRG LVL3 (GOWN DISPOSABLE) ×6 IMPLANT
NEEDLE SPNL 18GX3.5 QUINCKE PK (NEEDLE) ×3 IMPLANT
PACK VAGINAL MINOR WOMEN LF (CUSTOM PROCEDURE TRAY) ×3 IMPLANT
PAD OB MATERNITY 4.3X12.25 (PERSONAL CARE ITEMS) ×3 IMPLANT
PAD PREP 24X48 CUFFED NSTRL (MISCELLANEOUS) ×3 IMPLANT
SYR CONTROL 10ML LL (SYRINGE) ×3 IMPLANT
TOWEL OR 17X24 6PK STRL BLUE (TOWEL DISPOSABLE) ×6 IMPLANT

## 2013-07-11 NOTE — Op Note (Signed)
07/11/2013  1:01 PM  PATIENT:  Andrea Cisneros  63 y.o. female  PRE-OPERATIVE DIAGNOSIS:  cpt 58120 -postmenopausal bleeding  POST-OPERATIVE DIAGNOSIS:  cpt 58120 -postmenopausal bleeding  PROCEDURE:  Procedure(s): DILATATION AND CURETTAGE (N/A)  SURGEON:  Surgeon(s) and Role:    * Emily Filbert, MD - Primary  PHYSICIAN ASSISTANT:   ASSISTANTS: none   ANESTHESIA:   paracervical block and MAC  EBL:  Total I/O In: 600 [I.V.:600] Out: -   BLOOD ADMINISTERED:none  DRAINS: none   LOCAL MEDICATIONS USED:  MARCAINE     SPECIMEN:  Source of Specimen:  uterine curettings  DISPOSITION OF SPECIMEN:  PATHOLOGY  COUNTS:  YES  TOURNIQUET:  * No tourniquets in log *  DICTATION: .Dragon Dictation  PLAN OF CARE: Discharge to home after PACU  PATIENT DISPOSITION:  PACU - hemodynamically stable.   Delay start of Pharmacological VTE agent (>24hrs) due to surgical blood loss or risk of bleeding: not applicable   The risks, benefits, and alternatives of surgery were explained, understood, and accepted. All questions were answered. Consents were signed. In the operating room MAC anesthesia was applied without complication, and she was placed in the dorsal lithotomy position. Her vagina was prepped and draped in the usual sterile fashion. A Robinson catheter was used to drain her bladder. A bimanual exam revealed a 6 week size , retroverted uterus. Her adnexa were nonenlarged. A speculum was placed and a single-tooth tenaculum was used to grasp the anterior lip of her cervix. A total of 30 mL of 0.5% Marcaine was used to perform a paracervical block. Her uterus sounded to 9 cm. Her cervix was carefully and slowly and easily dilated to accommodate a small sharp curet. A curettage was done in all quadrants and the fundus of the uterus. A very small amount of tissue was obtained. A gritty sensation was appreciated throughout the uterus. There was no bleeding noted at the end of the case. She was  taken to the recovery room after being extubated. She tolerated the procedure well.

## 2013-07-11 NOTE — Anesthesia Postprocedure Evaluation (Signed)
  Anesthesia Post Note  Patient: Andrea Cisneros  Procedure(s) Performed: Procedure(s) (LRB): DILATATION AND CURETTAGE (N/A)  Anesthesia type: MAC  Patient location: PACU  Post pain: Pain level controlled  Post assessment: Post-op Vital signs reviewed  Last Vitals:  Filed Vitals:   07/11/13 1302  BP: 114/67  Pulse: 58  Temp: 36.7 C  Resp: 22    Post vital signs: Reviewed  Level of consciousness: sedated  Complications: No apparent anesthesia complications

## 2013-07-11 NOTE — Discharge Instructions (Signed)
DISCHARGE INSTRUCTIONS: D&C The following instructions have been prepared to help you care for yourself upon your return home.  MAY TAKE IBUPROFEN (MOTRIN, ADVIL) OR ALEVE AFTER 7:00 PM FOR CRAMPS!!!   Personal hygiene:  Use sanitary pads for vaginal drainage, not tampons.  Shower the day after your procedure.  NO tub baths, pools or Jacuzzis for 2-3 weeks.  Wipe front to back after using the bathroom.  Activity and limitations:  Do NOT drive or operate any equipment for 24 hours. The effects of anesthesia are still present and drowsiness may result.  Do NOT rest in bed all day.  Walking is encouraged.  Walk up and down stairs slowly.  You may resume your normal activity in one to two days or as indicated by your physician.  Sexual activity: NO intercourse for at least 2 weeks after the procedure, or as indicated by your physician.  Diet: Eat a light meal as desired this evening. You may resume your usual diet tomorrow.  Return to work: You may resume your work activities in one to two days or as indicated by your doctor.  What to expect after your surgery: Expect to have vaginal bleeding/discharge for 2-3 days and spotting for up to 10 days. It is not unusual to have soreness for up to 1-2 weeks. You may have a slight burning sensation when you urinate for the first day. Mild cramps may continue for a couple of days. You may have a regular period in 2-6 weeks.  Call your doctor for any of the following:  Excessive vaginal bleeding, saturating and changing one pad every hour.  Inability to urinate 6 hours after discharge from hospital.  Pain not relieved by pain medication.  Fever of 100.4 F or greater.  Unusual vaginal discharge or odor.   Call for an appointment:    Patients signature: ______________________  Nurses signature ________________________  Support person's signature_______________________

## 2013-07-11 NOTE — Anesthesia Preprocedure Evaluation (Signed)
Anesthesia Evaluation  Patient identified by MRN, date of birth, ID band Patient awake    Reviewed: Allergy & Precautions, H&P , NPO status , Patient's Chart, lab work & pertinent test results  Airway Mallampati: II TM Distance: >3 FB Neck ROM: full    Dental no notable dental hx. (+) Teeth Intact   Pulmonary neg pulmonary ROS, Current Smoker,    Pulmonary exam normal       Cardiovascular negative cardio ROS      Neuro/Psych    GI/Hepatic Neg liver ROS,   Endo/Other    Renal/GU negative Renal ROS     Musculoskeletal   Abdominal (+) + obese,   Peds  Hematology negative hematology ROS (+)   Anesthesia Other Findings   Reproductive/Obstetrics negative OB ROS                           Anesthesia Physical Anesthesia Plan  ASA: III  Anesthesia Plan: MAC   Post-op Pain Management:    Induction: Intravenous  Airway Management Planned:   Additional Equipment:   Intra-op Plan:   Post-operative Plan:   Informed Consent: I have reviewed the patients History and Physical, chart, labs and discussed the procedure including the risks, benefits and alternatives for the proposed anesthesia with the patient or authorized representative who has indicated his/her understanding and acceptance.     Plan Discussed with: CRNA and Surgeon  Anesthesia Plan Comments:         Anesthesia Quick Evaluation

## 2013-07-11 NOTE — Transfer of Care (Signed)
Immediate Anesthesia Transfer of Care Note  Patient: Andrea Cisneros  Procedure(s) Performed: Procedure(s): DILATATION AND CURETTAGE (N/A)  Patient Location: PACU  Anesthesia Type:MAC  Level of Consciousness: awake, alert  and oriented  Airway & Oxygen Therapy: Patient Spontanous Breathing  Post-op Assessment: Report given to PACU RN and Post -op Vital signs reviewed and stable  Post vital signs: Reviewed and stable  Complications: No apparent anesthesia complications

## 2013-07-11 NOTE — H&P (Signed)
This lovely 63 yo lady is here today for the issue of PMB. She reports menopause about 12 years ago and then she had an episode of PMB in 2014. I could not find a work up after a gyn u/s showed fibroids and an endometrial stripe of 5.9 mm. She had a d&c for a miscarriage in the distant past and has no questions about a d&c.      Pertinent Gynecological History: Menses: post-menopausal Bleeding: post menopausal bleeding Contraception: none DES exposure: denies Blood transfusions: none Sexually transmitted diseases: no past history Previous GYN Procedures: DNC  Last mammogram: normal  Last pap: normal     Menstrual History:  No LMP recorded. Patient is postmenopausal.    Past Medical History  Diagnosis Date  . Normal cardiac stress test 08/29/2010    [facility], [place] [facility]    Past Surgical History  Procedure Laterality Date  . Partial colectomy for diverticulitis    . Tubal ligation    . Right arm    . Gastric bypass      > 21    Family History  Problem Relation Age of Onset  . Heart attack Mother   . Diabetes Brother     Social History:  reports that she has been smoking Cigarettes.  She has been smoking about 0.00 packs per day. She does not have any smokeless tobacco history on file. She reports that she drinks alcohol. She reports that she does not use illicit drugs.  Allergies:  Allergies  Allergen Reactions  . Lisinopril Anaphylaxis    REACTION: swelling and angioedema  . Lyrica [Pregabalin]     Upset stomach  . Shellfish Allergy     No prescriptions prior to admission    ROS  There were no vitals taken for this visit. Physical Exam Heart-rrr Lungs- CTAB Abd- benign No results found for this or any previous visit (from the past 24 hour(s)).  No results found.  Assessment/Plan: PMB- plan for d&c   Chirsty Armistead C Durk Carmen 07/11/2013, 10:18 AM

## 2013-07-14 ENCOUNTER — Encounter (HOSPITAL_COMMUNITY): Payer: Self-pay | Admitting: Obstetrics & Gynecology

## 2013-07-25 ENCOUNTER — Other Ambulatory Visit: Payer: Self-pay | Admitting: Family Medicine

## 2013-08-01 SURGERY — Surgical Case
Anesthesia: *Unknown

## 2013-08-18 ENCOUNTER — Other Ambulatory Visit: Payer: Self-pay | Admitting: Family Medicine

## 2013-08-26 ENCOUNTER — Telehealth: Payer: Self-pay

## 2013-08-26 NOTE — Telephone Encounter (Signed)
Left message for patient to schedule a follow up on DM.

## 2013-08-28 ENCOUNTER — Ambulatory Visit (INDEPENDENT_AMBULATORY_CARE_PROVIDER_SITE_OTHER): Payer: Medicare Other | Admitting: Obstetrics & Gynecology

## 2013-08-28 DIAGNOSIS — Z09 Encounter for follow-up examination after completed treatment for conditions other than malignant neoplasm: Secondary | ICD-10-CM

## 2013-08-28 DIAGNOSIS — N950 Postmenopausal bleeding: Secondary | ICD-10-CM

## 2013-08-28 MED ORDER — MEDROXYPROGESTERONE ACETATE 10 MG PO TABS: ORAL_TABLET | ORAL | Status: AC

## 2013-08-28 NOTE — Progress Notes (Signed)
   Subjective:    Patient ID: Andrea Cisneros, female    DOB: 02-Jan-1951, 63 y.o.   MRN: [id number]  HPI 63 yo AA lady here for 6 week post op visit. No more PMB. No gyn problems.   Review of Systems     Objective:   Physical Exam        Assessment & Plan:  No endometrial cells seen on pathology report. Endometrium 5.9 mm on u/s pre operatively. I will give her cyclic provera for 3 months and re check gyn u/s to assure that lining is less than 5 mm

## 2013-09-04 ENCOUNTER — Ambulatory Visit: Payer: Medicare Other | Admitting: Family Medicine

## 2013-09-08 ENCOUNTER — Ambulatory Visit (INDEPENDENT_AMBULATORY_CARE_PROVIDER_SITE_OTHER): Payer: Medicare Other | Admitting: Family Medicine

## 2013-09-08 ENCOUNTER — Encounter: Payer: Self-pay | Admitting: Family Medicine

## 2013-09-08 VITALS — BP 143/93 | HR 104 | Ht 67.0 in | Wt 242.8 lb

## 2013-09-08 DIAGNOSIS — M797 Fibromyalgia: Secondary | ICD-10-CM

## 2013-09-08 DIAGNOSIS — IMO0001 Reserved for inherently not codable concepts without codable children: Secondary | ICD-10-CM

## 2013-09-08 DIAGNOSIS — E1165 Type 2 diabetes mellitus with hyperglycemia: Principal | ICD-10-CM

## 2013-09-08 DIAGNOSIS — G4700 Insomnia, unspecified: Secondary | ICD-10-CM

## 2013-09-08 DIAGNOSIS — I10 Essential (primary) hypertension: Secondary | ICD-10-CM

## 2013-09-08 LAB — POCT GLYCOSYLATED HEMOGLOBIN (HGB A1C): Hemoglobin A1C: 7.7

## 2013-09-08 MED ORDER — VERAPAMIL HCL ER 240 MG PO TBCR: EXTENDED_RELEASE_TABLET | ORAL | Status: AC

## 2013-09-08 MED ORDER — ATENOLOL 50 MG PO TABS: ORAL_TABLET | ORAL | Status: AC

## 2013-09-08 MED ORDER — GABAPENTIN 100 MG PO CAPS: 200.0000 mg | ORAL_CAPSULE | Freq: Every day | ORAL | Status: AC

## 2013-09-08 MED ORDER — VERAPAMIL HCL ER 120 MG PO TBCR: EXTENDED_RELEASE_TABLET | ORAL | Status: AC

## 2013-09-08 MED ORDER — METFORMIN HCL 500 MG PO TABS: 500.0000 mg | ORAL_TABLET | Freq: Two times a day (BID) | ORAL | Status: AC

## 2013-09-08 MED ORDER — IBUPROFEN 600 MG PO TABS: 600.0000 mg | ORAL_TABLET | Freq: Four times a day (QID) | ORAL | Status: AC | PRN

## 2013-09-08 MED ORDER — ZOLPIDEM TARTRATE 5 MG PO TABS: 5.0000 mg | ORAL_TABLET | Freq: Every evening | ORAL | Status: AC | PRN

## 2013-09-08 MED ORDER — CITALOPRAM HYDROBROMIDE 20 MG PO TABS: ORAL_TABLET | ORAL | Status: AC

## 2013-09-08 NOTE — Progress Notes (Signed)
   Subjective:    Patient ID: Andrea Cisneros, female    DOB: [birth year], 63 y.o.   MRN: [id number]  HPI Diabetes - no hypoglycemic events. No wounds or sores that are not healing well. No increased thirst or urination. Checking glucose at home. Taking medications as prescribed without any side effects. Lab Results  Component Value Date   HGBA1C 6.4 05/15/2013   Insomnia - most nights she can fall asleep without any problem. having hard time stayin asleep. Tried z-quail but caused her to have a crawling sensation in her legs.  She has tried taking her diazepam to help her sleep and doesn't work at all.  Has had a tingling in her left arm x 2 weeks.  Say feels like it wraps around like a spiral. She did restart her neurontin and tha has reall yhelped her firbromyalgia.   Hypertension- Pt denies chest pain, SOB, dizziness, or heart palpitations.  Taking meds as directed w/o problems.  Denies medication side effects.    Fibromyalgia-she did restart her gabapentin and says it has been helpful. She increase to 2 tabs at bedtime and has worked great to help control her pain. She will need a new prescription sent to pharmacy.  Review of Systems     Objective:   Physical Exam  Constitutional: She is oriented to person, place, and time. She appears well-developed and well-nourished.  HENT:  Head: Normocephalic and atraumatic.  Cardiovascular: Normal rate, regular rhythm and normal heart sounds.   Pulmonary/Chest: Effort normal and breath sounds normal.  Neurological: She is alert and oriented to person, place, and time.  Skin: Skin is warm and dry.  Psychiatric: She has a normal mood and affect. Her behavior is normal.          Assessment & Plan:  DM-  Uncontrolled with A1C at 7.7 today.  We discussed different options. We'll increase her metformin to twice a day for now. She did start taking a lot of soda recently. Discussed importance of getting back off of this and following up in 3  months and getting back on track. She did have her eye exam about 2 months ago at my eye Dr. Aletha Halim call to get notes.  Insomnia - Discussed tx options.  Will try ambien 5mg . Start with half a tab and increase to whole if needed. Stop if too sedating. don't mix with the diazepam.   Hypertension-not well controlled today. Typically her blood pressure looks better. I will hold off on making any changes today. Continue work on low-salt diet and regular exercise and recheck in 3 months. If not a goal then we will need to adjust her regimen.  Fibromyalgia. - Now on neuronting 200mg  at bedtime and working well.  RF sent today.

## 2013-09-12 ENCOUNTER — Ambulatory Visit: Payer: Medicare Other | Admitting: Family Medicine

## 2013-09-15 ENCOUNTER — Other Ambulatory Visit: Payer: Self-pay | Admitting: Physician Assistant

## 2013-09-15 ENCOUNTER — Telehealth: Payer: Self-pay | Admitting: Family Medicine

## 2013-09-15 DIAGNOSIS — M21611 Bunion of right foot: Secondary | ICD-10-CM

## 2013-09-15 DIAGNOSIS — M21612 Bunion of left foot: Principal | ICD-10-CM

## 2013-09-15 DIAGNOSIS — L84 Corns and callosities: Secondary | ICD-10-CM

## 2013-09-15 NOTE — Telephone Encounter (Signed)
Patient is requesting for a referral to Dr. Jearl Klinefelter ( Podiatry ) in [place] P# [phone] For foot Bunions, Callus, and Toe nail is really thick. Thanks

## 2013-09-15 NOTE — Telephone Encounter (Signed)
Done

## 2013-09-29 ENCOUNTER — Encounter: Payer: Self-pay | Admitting: Sports Medicine

## 2013-09-29 ENCOUNTER — Ambulatory Visit (INDEPENDENT_AMBULATORY_CARE_PROVIDER_SITE_OTHER): Payer: Medicare Other | Admitting: Sports Medicine

## 2013-09-29 VITALS — BP 144/97 | HR 75 | Ht 67.0 in | Wt 239.0 lb

## 2013-09-29 DIAGNOSIS — M170 Bilateral primary osteoarthritis of knee: Secondary | ICD-10-CM

## 2013-09-29 DIAGNOSIS — M1710 Unilateral primary osteoarthritis, unspecified knee: Secondary | ICD-10-CM

## 2013-09-29 NOTE — Progress Notes (Signed)
  Subjective:    CC: Recheck knees  HPI: We finished viscous supplementation along Ms. Ligas back in May, her right knee is completely asymptomatic but she still has some pain in the left knee, localized worse at the medial joint line. Moderate, persistent without mechanical symptoms, she does desire repeat injection.  Past medical history, Surgical history, Family history not pertinant except as noted below, Social history, Allergies, and medications have been entered into the medical record, reviewed, and no changes needed.   Review of Systems: No fevers, chills, night sweats, weight loss, chest pain, or shortness of breath.   Objective:    General: Well Developed, well nourished, and in no acute distress.  Neuro: Alert and oriented x3, extra-ocular muscles intact, sensation grossly intact.  HEENT: Normocephalic, atraumatic, pupils equal round reactive to light, neck supple, no masses, no lymphadenopathy, thyroid nonpalpable.  Skin: Warm and dry, no rashes. Cardiac: Regular rate and rhythm, no murmurs rubs or gallops, no lower extremity edema.  Respiratory: Clear to auscultation bilaterally. Not using accessory muscles, speaking in full sentences.  Procedure: Real-time Ultrasound Guided Injection of left knee  Device: GE Logiq E  Verbal informed consent obtained.  Time-out conducted.  Noted no overlying erythema, induration, or other signs of local infection.  Skin prepped in a sterile fashion.  Local anesthesia: Topical Ethyl chloride.  With sterile technique and under real time ultrasound guidance: 1 cc Depo-Medrol 40, 4 cc lidocaine injected easily.  Completed without difficulty  Pain immediately resolved suggesting accurate placement of the medication.  Advised to call if fevers/chills, erythema, induration, drainage, or persistent bleeding.  Images permanently stored and available for review in the ultrasound unit.  Impression: Technically successful ultrasound guided  injection.  Impression and Recommendations:

## 2013-09-29 NOTE — Assessment & Plan Note (Addendum)
Doing okay after viscous supplementation, right knee is pain-free, left knee still has some pain. Depo-Medrol injection into the left knee. Also blood thinners now so adding Celebrex. Return in a month.

## 2013-10-06 ENCOUNTER — Other Ambulatory Visit: Payer: Self-pay | Admitting: Family Medicine

## 2013-10-07 ENCOUNTER — Encounter: Payer: Medicare Other | Admitting: Family Medicine

## 2013-11-18 ENCOUNTER — Encounter: Payer: Self-pay | Admitting: Family Medicine

## 2013-12-15 ENCOUNTER — Encounter: Payer: Self-pay | Admitting: Sports Medicine

## 2015-03-04 IMAGING — CR DG KNEE COMPLETE 4+V*R*
3 series · 3 of 3 positions shown · non-contrast
Comparison: Left knee radiographs of the same day.

CLINICAL DATA: Knee osteoarthritis.

RIGHT KNEE - COMPLETE 4+ VIEW

[w knee ap right * (1 of 2)]
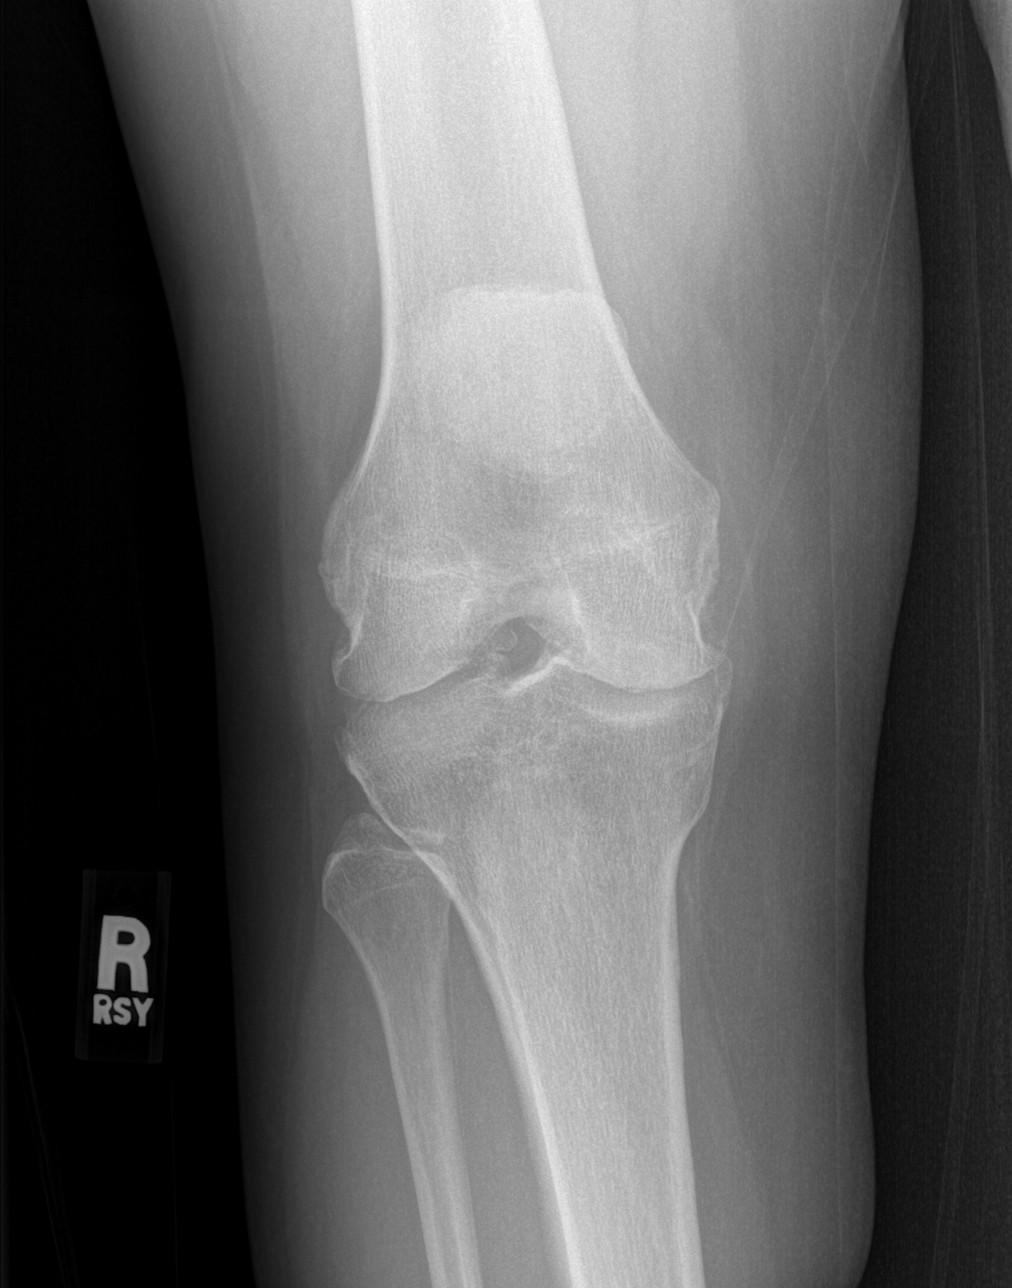

[w knee ap right * (2 of 2)]
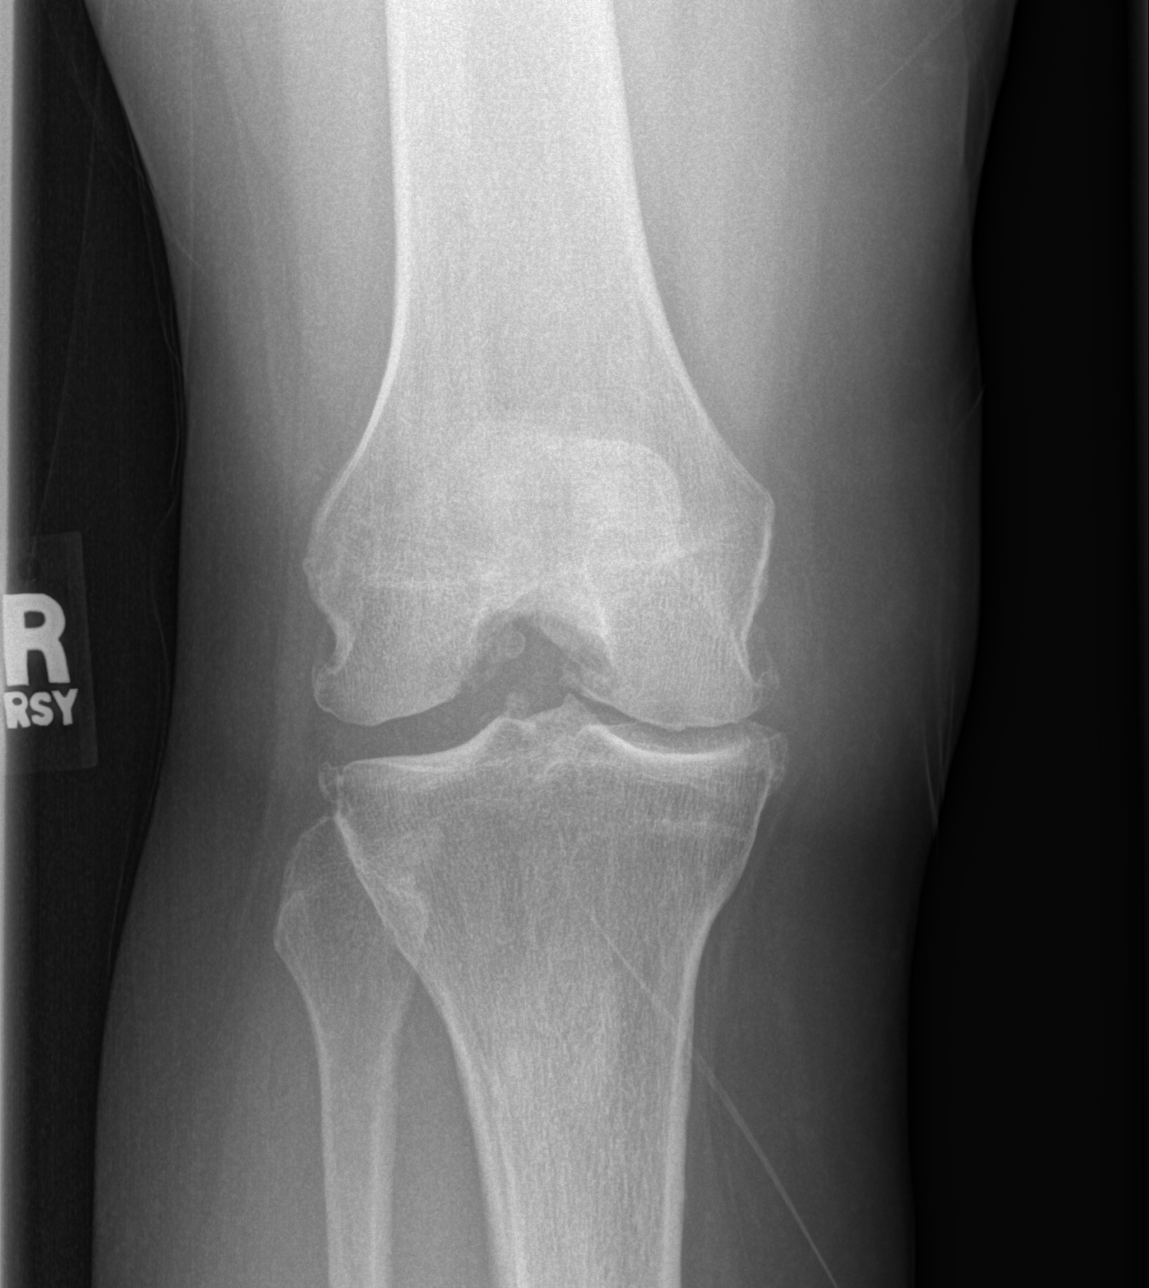

[t knee lat right]
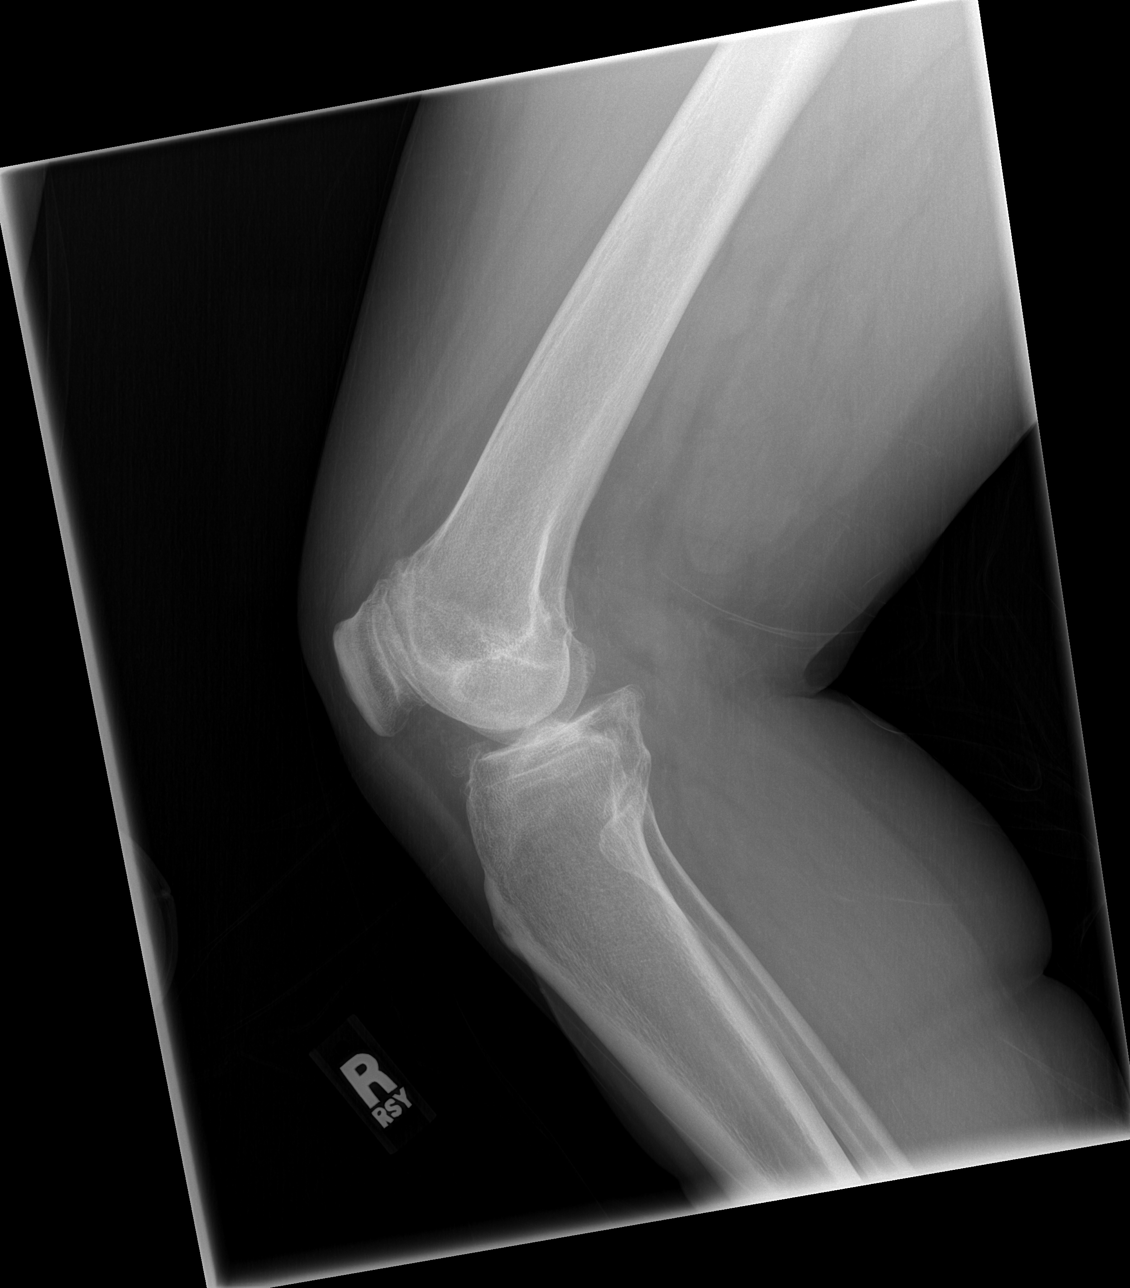

[3 of 3 positions shown; findings below may reference images not displayed]

FINDINGS: The right knee is located.  Mild moderate to degenerative
changes are noted within the medial and patellofemoral compartments
of the right knee.  There is no joint effusion.  Patellar changes
are symmetric medial collateral.
IMPRESSION: 1.  Mild to moderate degenerative changes within the medial and
patellofemoral compartments of the knee.
2.  No acute abnormality.
